# Patient Record
Sex: Male | Born: 1960 | Race: White | Hispanic: No | Marital: Married | Smoking: Never smoker
Health system: Southern US, Community
[De-identification: ages and names within clinical notes are randomized; demographics above are authoritative.]

## PROBLEM LIST (undated history)

## (undated) DIAGNOSIS — I10 Essential (primary) hypertension: Secondary | ICD-10-CM

## (undated) HISTORY — PX: BACK SURGERY: SHX140

---

## 2007-10-24 ENCOUNTER — Emergency Department (HOSPITAL_BASED_OUTPATIENT_CLINIC_OR_DEPARTMENT_OTHER): Admission: EM | Admit: 2007-10-24 | Discharge: 2007-10-24 | Payer: Self-pay | Admitting: Emergency Medicine

## 2013-05-18 ENCOUNTER — Other Ambulatory Visit: Payer: Self-pay | Admitting: Family Medicine

## 2013-05-18 DIAGNOSIS — IMO0002 Reserved for concepts with insufficient information to code with codable children: Secondary | ICD-10-CM

## 2013-05-22 ENCOUNTER — Ambulatory Visit
Admission: RE | Admit: 2013-05-22 | Discharge: 2013-05-22 | Disposition: A | Discharge status: Home / Self Care | Payer: Federal, State, Local not specified - PPO | Source: Ambulatory Visit | Attending: Family Medicine | Admitting: Family Medicine

## 2013-05-22 DIAGNOSIS — IMO0002 Reserved for concepts with insufficient information to code with codable children: Secondary | ICD-10-CM

## 2015-06-30 DIAGNOSIS — Z125 Encounter for screening for malignant neoplasm of prostate: Secondary | ICD-10-CM | POA: Diagnosis not present

## 2015-06-30 DIAGNOSIS — Z0000 Encounter for general adult medical examination without abnormal findings: Secondary | ICD-10-CM | POA: Diagnosis not present

## 2015-06-30 DIAGNOSIS — Z23 Encounter for immunization: Secondary | ICD-10-CM | POA: Diagnosis not present

## 2015-06-30 DIAGNOSIS — Z13220 Encounter for screening for lipoid disorders: Secondary | ICD-10-CM | POA: Diagnosis not present

## 2015-07-01 DIAGNOSIS — K080 Exfoliation of teeth due to systemic causes: Secondary | ICD-10-CM | POA: Diagnosis not present

## 2016-01-07 DIAGNOSIS — K080 Exfoliation of teeth due to systemic causes: Secondary | ICD-10-CM | POA: Diagnosis not present

## 2016-01-13 DIAGNOSIS — K080 Exfoliation of teeth due to systemic causes: Secondary | ICD-10-CM | POA: Diagnosis not present

## 2016-03-18 DIAGNOSIS — J069 Acute upper respiratory infection, unspecified: Secondary | ICD-10-CM | POA: Diagnosis not present

## 2016-05-26 DIAGNOSIS — K080 Exfoliation of teeth due to systemic causes: Secondary | ICD-10-CM | POA: Diagnosis not present

## 2016-05-27 DIAGNOSIS — K080 Exfoliation of teeth due to systemic causes: Secondary | ICD-10-CM | POA: Diagnosis not present

## 2016-07-13 DIAGNOSIS — Z0000 Encounter for general adult medical examination without abnormal findings: Secondary | ICD-10-CM | POA: Diagnosis not present

## 2016-07-13 DIAGNOSIS — K080 Exfoliation of teeth due to systemic causes: Secondary | ICD-10-CM | POA: Diagnosis not present

## 2017-01-31 DIAGNOSIS — K080 Exfoliation of teeth due to systemic causes: Secondary | ICD-10-CM | POA: Diagnosis not present

## 2017-03-23 DIAGNOSIS — K080 Exfoliation of teeth due to systemic causes: Secondary | ICD-10-CM | POA: Diagnosis not present

## 2017-08-12 DIAGNOSIS — J309 Allergic rhinitis, unspecified: Secondary | ICD-10-CM | POA: Diagnosis not present

## 2017-08-12 DIAGNOSIS — Z125 Encounter for screening for malignant neoplasm of prostate: Secondary | ICD-10-CM | POA: Diagnosis not present

## 2017-08-12 DIAGNOSIS — Z0000 Encounter for general adult medical examination without abnormal findings: Secondary | ICD-10-CM | POA: Diagnosis not present

## 2017-08-12 DIAGNOSIS — Z13220 Encounter for screening for lipoid disorders: Secondary | ICD-10-CM | POA: Diagnosis not present

## 2017-08-12 DIAGNOSIS — I10 Essential (primary) hypertension: Secondary | ICD-10-CM | POA: Diagnosis not present

## 2017-10-31 DIAGNOSIS — K080 Exfoliation of teeth due to systemic causes: Secondary | ICD-10-CM | POA: Diagnosis not present

## 2018-01-10 DIAGNOSIS — K080 Exfoliation of teeth due to systemic causes: Secondary | ICD-10-CM | POA: Diagnosis not present

## 2018-05-15 DIAGNOSIS — K080 Exfoliation of teeth due to systemic causes: Secondary | ICD-10-CM | POA: Diagnosis not present

## 2018-05-31 DIAGNOSIS — H5203 Hypermetropia, bilateral: Secondary | ICD-10-CM | POA: Diagnosis not present

## 2018-10-28 ENCOUNTER — Other Ambulatory Visit: Payer: Self-pay

## 2018-10-28 ENCOUNTER — Emergency Department (HOSPITAL_BASED_OUTPATIENT_CLINIC_OR_DEPARTMENT_OTHER)
Admission: EM | Admit: 2018-10-28 | Discharge: 2018-10-28 | Disposition: A | Discharge status: Home / Self Care | Payer: Federal, State, Local not specified - PPO | Attending: Emergency Medicine | Admitting: Emergency Medicine

## 2018-10-28 ENCOUNTER — Encounter (HOSPITAL_BASED_OUTPATIENT_CLINIC_OR_DEPARTMENT_OTHER): Payer: Self-pay | Admitting: Emergency Medicine

## 2018-10-28 ENCOUNTER — Emergency Department (HOSPITAL_BASED_OUTPATIENT_CLINIC_OR_DEPARTMENT_OTHER): Payer: Federal, State, Local not specified - PPO

## 2018-10-28 DIAGNOSIS — R5383 Other fatigue: Secondary | ICD-10-CM | POA: Diagnosis not present

## 2018-10-28 DIAGNOSIS — I10 Essential (primary) hypertension: Secondary | ICD-10-CM | POA: Diagnosis not present

## 2018-10-28 DIAGNOSIS — U071 COVID-19: Secondary | ICD-10-CM | POA: Diagnosis not present

## 2018-10-28 DIAGNOSIS — R509 Fever, unspecified: Secondary | ICD-10-CM | POA: Insufficient documentation

## 2018-10-28 DIAGNOSIS — R1110 Vomiting, unspecified: Secondary | ICD-10-CM | POA: Diagnosis not present

## 2018-10-28 DIAGNOSIS — R05 Cough: Secondary | ICD-10-CM | POA: Diagnosis not present

## 2018-10-28 DIAGNOSIS — Z79899 Other long term (current) drug therapy: Secondary | ICD-10-CM | POA: Insufficient documentation

## 2018-10-28 DIAGNOSIS — R531 Weakness: Secondary | ICD-10-CM | POA: Diagnosis not present

## 2018-10-28 HISTORY — DX: Essential (primary) hypertension: I10

## 2018-10-28 LAB — CBC
HCT: 44.1 % (ref 39.0–52.0)
Hemoglobin: 14.5 g/dL (ref 13.0–17.0)
MCH: 30.5 pg (ref 26.0–34.0)
MCHC: 32.9 g/dL (ref 30.0–36.0)
MCV: 92.6 fL (ref 80.0–100.0)
Platelets: 209 10*3/uL (ref 150–400)
RBC: 4.76 MIL/uL (ref 4.22–5.81)
RDW: 11.9 % (ref 11.5–15.5)
WBC: 6.8 10*3/uL (ref 4.0–10.5)
nRBC: 0 % (ref 0.0–0.2)

## 2018-10-28 LAB — URINALYSIS, ROUTINE W REFLEX MICROSCOPIC
Bilirubin Urine: NEGATIVE
Glucose, UA: NEGATIVE mg/dL
Hgb urine dipstick: NEGATIVE
Ketones, ur: NEGATIVE mg/dL
Leukocytes,Ua: NEGATIVE
Nitrite: NEGATIVE
Protein, ur: NEGATIVE mg/dL
Specific Gravity, Urine: 1.02 (ref 1.005–1.030)
pH: 7.5 (ref 5.0–8.0)

## 2018-10-28 LAB — SARS CORONAVIRUS 2 BY RT PCR (HOSPITAL ORDER, PERFORMED IN ~~LOC~~ HOSPITAL LAB): SARS Coronavirus 2: POSITIVE — AB

## 2018-10-28 LAB — BASIC METABOLIC PANEL
Anion gap: 9 (ref 5–15)
BUN: 13 mg/dL (ref 6–20)
CO2: 29 mmol/L (ref 22–32)
Calcium: 9 mg/dL (ref 8.9–10.3)
Chloride: 99 mmol/L (ref 98–111)
Creatinine, Ser: 1.08 mg/dL (ref 0.61–1.24)
GFR calc Af Amer: >60 mL/min (ref >60)
GFR calc non Af Amer: >60 mL/min (ref >60)
Glucose, Bld: 102 mg/dL — ABNORMAL HIGH (ref 70–99)
Potassium: 3.1 mmol/L — ABNORMAL LOW (ref 3.5–5.1)
Sodium: 137 mmol/L (ref 135–145)

## 2018-10-28 MED ORDER — SODIUM CHLORIDE 0.9 % IV BOLUS (SEPSIS)
500.0000 mL | Freq: Once | INTRAVENOUS | Status: AC
  Administered 2018-10-28: 500 mL via INTRAVENOUS

## 2018-10-28 MED ORDER — ONDANSETRON HCL 4 MG/2ML IJ SOLN
INTRAMUSCULAR | Status: AC
  Administered 2018-10-28: 4 mg
  Filled 2018-10-28: qty 2

## 2018-10-28 MED ORDER — SODIUM CHLORIDE 0.9 % IV SOLN
1000.0000 mL | INTRAVENOUS | Status: DC
  Administered 2018-10-28: 1000 mL via INTRAVENOUS

## 2018-10-28 MED ORDER — ONDANSETRON 8 MG PO TBDP: 8.0000 mg | ORAL_TABLET | Freq: Three times a day (TID) | ORAL | 0 refills | Status: AC | PRN

## 2018-10-28 NOTE — ED Notes (Signed)
ED Provider at bedside to update pt .

## 2018-10-28 NOTE — Discharge Instructions (Addendum)
Please review the discharge instructions regarding COVID-19.  Stay home away from work until you are cleared to return.  Return to the emergency room if you start having shortness of breath or other concerning symptoms

## 2018-10-28 NOTE — ED Triage Notes (Signed)
Patient states that he stared to feel weak and fatigued on Thursday after mowing the grass. Since then he started to have a cough and fever as high at 101.9 - patient took tylenol at 0400 - patient states that he feels better right now just weak  - Dr. Rex Kras sent here for evaluation of Pneumonia

## 2018-10-28 NOTE — ED Notes (Signed)
ED MD and Cristie Hem RN informed about +COVID

## 2018-10-28 NOTE — ED Notes (Signed)
While triaging the patient he became lighthead and felt like he was going to pass out - patient did have LOC x 1 minutes - Vitals obtained HR down to 44 and b/p down to 71/48 - patient aroused after 1 minutes MD to the bedside and orders received - patient moved to Room for continual monitoring

## 2018-10-28 NOTE — ED Provider Notes (Signed)
[facility] EMERGENCY DEPARTMENT Provider Note   CSN: [id number] Arrival date & time: 10/28/18  1058    History   Chief Complaint Chief Complaint  Patient presents with  . Cough  . Loss of Consciousness    HPI Nicholas Shields is a 58 y.o. male.     HPI Pt started having sx earlier this week after working out in the heat.  He was mowing the lawn.  He did not think too much of it but felt very fatigued.  He started to develop a cough.  He was not able to go to work yesterday.  He started having a fever up to 101.  He began shaking and shivering.  Pt called the doctor today and they suggested he go to the ED for evaluation.  While in triage he had an episode of feeling weak as if he was going to pass out.  Pt vomited.  He is feeling better at this time. Past Medical History:  Diagnosis Date  . Hypertension     There are no active problems to display for this patient.   History reviewed. No pertinent surgical history.      Home Medications    Prior to Admission medications   Medication Sig Start Date End Date Taking? Authorizing Provider  fluticasone Asencion Islam) 50 MCG/ACT nasal spray  05/10/18   [provider]  hydrochlorothiazide (HYDRODIURIL) 25 MG tablet  09/14/18   [provider]  levocetirizine (XYZAL) 5 MG tablet  09/18/18   [provider]  losartan (COZAAR) 100 MG tablet  09/14/18   [provider]  ondansetron (ZOFRAN ODT) 8 MG disintegrating tablet Take 1 tablet (8 mg total) by mouth every 8 (eight) hours as needed for nausea or vomiting. 10/28/18   Dorie Rank, MD    Family History History reviewed. No pertinent family history.  Social History Social History   Tobacco Use  . Smoking status: Never Smoker  . Smokeless tobacco: Never Used  Substance Use Topics  . Alcohol use: Never    Frequency: Never  . Drug use: Never     Allergies   Penicillins   Review of Systems Review of Systems  Constitutional:  Positive for fever.  Respiratory: Positive for cough.   Cardiovascular: Negative for chest pain.  Gastrointestinal: Negative for abdominal pain.  Genitourinary: Negative for dysuria.  Neurological: Negative for headaches.     Physical Exam Updated Vital Signs BP 133/82   Pulse 78   Temp 99.1 F (37.3 C) (Oral)   Resp 13   Ht 1.753 m (5\' 9" )   Wt 81.6 kg   SpO2 97%   BMI 26.58 kg/m   Physical Exam Vitals signs and nursing note reviewed.  Constitutional:      General: He is not in acute distress.    Appearance: He is well-developed.  HENT:     Head: Normocephalic and atraumatic.     Right Ear: External ear normal.     Left Ear: External ear normal.  Eyes:     General: No scleral icterus.       Right eye: No discharge.        Left eye: No discharge.     Conjunctiva/sclera: Conjunctivae normal.  Neck:     Musculoskeletal: Neck supple.     Trachea: No tracheal deviation.  Cardiovascular:     Rate and Rhythm: Normal rate and regular rhythm.  Pulmonary:     Effort: Pulmonary effort is normal. No respiratory distress.  Breath sounds: Normal breath sounds. No stridor. No wheezing or rales.  Abdominal:     General: Bowel sounds are normal. There is no distension.     Palpations: Abdomen is soft.     Tenderness: There is no abdominal tenderness. There is no guarding or rebound.  Musculoskeletal:        General: No tenderness.  Skin:    General: Skin is warm and dry.     Findings: No rash.  Neurological:     Mental Status: He is alert.     Cranial Nerves: No cranial nerve deficit (no facial droop, extraocular movements intact, no slurred speech).     Sensory: No sensory deficit.     Motor: No abnormal muscle tone or seizure activity.     Coordination: Coordination normal.      ED Treatments / Results  Labs (all labs ordered are listed, but only abnormal results are displayed) Labs Reviewed  SARS CORONAVIRUS 2 (HOSPITAL ORDER, PERFORMED IN [facility]  LAB) - Abnormal; Notable for the following components:      Result Value   SARS Coronavirus 2 POSITIVE (*)    All other components within normal limits  BASIC METABOLIC PANEL - Abnormal; Notable for the following components:   Potassium 3.1 (*)    Glucose, Bld 102 (*)    All other components within normal limits  CBC  URINALYSIS, ROUTINE W REFLEX MICROSCOPIC    EKG EKG Interpretation  Date/Time:  Saturday October 28 2018 11:16:41 EDT Ventricular Rate:  72 PR Interval:  140 QRS Duration: 88 QT Interval:  354 QTC Calculation: 387 R Axis:   41 Text Interpretation:  ** Poor data quality, interpretation may be adversely affected Normal sinus rhythm Nonspecific T wave abnormality Abnormal ECG Poor data quality in current ECG precludes serial comparison No previous tracing Confirmed by Linwood DibblesKnapp, Chaniqua Brisby [phone]([id number]) on 10/28/2018 11:25:34 AM   Radiology Dg Chest Portable 1 View  Result Date: 10/28/2018 CLINICAL DATA:  Cough. EXAM: PORTABLE CHEST 1 VIEW COMPARISON:  None FINDINGS: The heart size appears within normal limits. There is scar versus platelike atelectasis in the left midlung and left base. No airspace opacities. No pulmonary edema or pleural effusion. IMPRESSION: Left midlung and left base scar versus platelike atelectasis. Electronically Signed   By: Signa Kellaylor  Stroud M.D.   On: 10/28/2018 12:14    Procedures Procedures (including critical care time)  Medications Ordered in ED Medications  ondansetron (ZOFRAN) 4 MG/2ML injection (4 mg  Given 10/28/18 1130)  sodium chloride 0.9 % bolus 500 mL (0 mLs Intravenous Stopped 10/28/18 1153)     Initial Impression / Assessment and Plan / ED Course  I have reviewed the triage vital signs and the nursing notes.  Pertinent labs & imaging results that were available during my care of the patient were reviewed by me and considered in my medical decision making (see chart for details).  Clinical Course as of Oct 28 1324  Sat Oct 28, 2018  1142 Bp  129/80   [JK]  1304 Patient's coronavirus test is positive.  Vitals are otherwise reassuring.  No hypoxia.   [JK]  1304 Chest x-ray without any definitive signs of pneumonia.   [JK]    Clinical Course User Index [JK] Linwood DibblesKnapp, Karolee Meloni, MD     Patient's ED work-up is notable for positive COVID-19.  This is the etiology of his weakness and fatigue.  Fortunately no signs of pneumonia and he has remained hemodynamically stable with no signs of any  hypoxia.  Patient did have a vasovagal episode after vomiting at triage.  His blood pressure has remained stable and he has not no further episodes of hypotension.  I doubt sepsis or acute cardiovascular event.  Patient was comfortable going home.  Will discharge with Zofran to take as needed.  Discussed the importance of returning if he has worsening issues with his breathing.  Final Clinical Impressions(s) / ED Diagnoses   Final diagnoses:  2019 novel coronavirus disease (COVID-19)    ED Discharge Orders         [facility]     10/28/18 1459    Temperature monitoring     10/28/18 1459    ondansetron (ZOFRAN ODT) 8 MG disintegrating tablet  Every 8 hours PRN     10/28/18 1500           Linwood DibblesKnapp, Charrisse Masley, MD 10/29/18 1326

## 2018-11-02 DIAGNOSIS — I10 Essential (primary) hypertension: Secondary | ICD-10-CM | POA: Diagnosis not present

## 2018-11-02 DIAGNOSIS — U071 COVID-19: Secondary | ICD-10-CM | POA: Diagnosis not present

## 2018-11-02 DIAGNOSIS — G479 Sleep disorder, unspecified: Secondary | ICD-10-CM | POA: Diagnosis not present

## 2018-11-02 DIAGNOSIS — E876 Hypokalemia: Secondary | ICD-10-CM | POA: Diagnosis not present

## 2018-12-07 DIAGNOSIS — E876 Hypokalemia: Secondary | ICD-10-CM | POA: Diagnosis not present

## 2018-12-12 DIAGNOSIS — H6123 Impacted cerumen, bilateral: Secondary | ICD-10-CM | POA: Diagnosis not present

## 2018-12-12 DIAGNOSIS — H9312 Tinnitus, left ear: Secondary | ICD-10-CM | POA: Diagnosis not present

## 2018-12-12 DIAGNOSIS — H9012 Conductive hearing loss, unilateral, left ear, with unrestricted hearing on the contralateral side: Secondary | ICD-10-CM | POA: Diagnosis not present

## 2019-10-24 DIAGNOSIS — B029 Zoster without complications: Secondary | ICD-10-CM | POA: Diagnosis not present

## 2019-12-19 DIAGNOSIS — D485 Neoplasm of uncertain behavior of skin: Secondary | ICD-10-CM | POA: Diagnosis not present

## 2019-12-19 DIAGNOSIS — Z125 Encounter for screening for malignant neoplasm of prostate: Secondary | ICD-10-CM | POA: Diagnosis not present

## 2019-12-19 DIAGNOSIS — E559 Vitamin D deficiency, unspecified: Secondary | ICD-10-CM | POA: Diagnosis not present

## 2019-12-19 DIAGNOSIS — Z0000 Encounter for general adult medical examination without abnormal findings: Secondary | ICD-10-CM | POA: Diagnosis not present

## 2019-12-19 DIAGNOSIS — J309 Allergic rhinitis, unspecified: Secondary | ICD-10-CM | POA: Diagnosis not present

## 2019-12-19 DIAGNOSIS — I10 Essential (primary) hypertension: Secondary | ICD-10-CM | POA: Diagnosis not present

## 2020-02-12 ENCOUNTER — Emergency Department (HOSPITAL_BASED_OUTPATIENT_CLINIC_OR_DEPARTMENT_OTHER): Payer: Federal, State, Local not specified - PPO

## 2020-02-12 ENCOUNTER — Encounter (HOSPITAL_BASED_OUTPATIENT_CLINIC_OR_DEPARTMENT_OTHER): Payer: Self-pay | Admitting: *Deleted

## 2020-02-12 ENCOUNTER — Emergency Department (HOSPITAL_BASED_OUTPATIENT_CLINIC_OR_DEPARTMENT_OTHER)
Admission: EM | Admit: 2020-02-12 | Discharge: 2020-02-12 | Disposition: A | Discharge status: Home / Self Care | Payer: Federal, State, Local not specified - PPO | Attending: Emergency Medicine | Admitting: Emergency Medicine

## 2020-02-12 ENCOUNTER — Other Ambulatory Visit: Payer: Self-pay

## 2020-02-12 ENCOUNTER — Other Ambulatory Visit (HOSPITAL_BASED_OUTPATIENT_CLINIC_OR_DEPARTMENT_OTHER): Payer: Self-pay | Admitting: Emergency Medicine

## 2020-02-12 DIAGNOSIS — R1030 Lower abdominal pain, unspecified: Secondary | ICD-10-CM | POA: Diagnosis not present

## 2020-02-12 DIAGNOSIS — N200 Calculus of kidney: Secondary | ICD-10-CM | POA: Diagnosis not present

## 2020-02-12 DIAGNOSIS — I10 Essential (primary) hypertension: Secondary | ICD-10-CM | POA: Insufficient documentation

## 2020-02-12 DIAGNOSIS — Z79899 Other long term (current) drug therapy: Secondary | ICD-10-CM | POA: Diagnosis not present

## 2020-02-12 DIAGNOSIS — R1110 Vomiting, unspecified: Secondary | ICD-10-CM | POA: Diagnosis not present

## 2020-02-12 LAB — URINALYSIS, ROUTINE W REFLEX MICROSCOPIC
Glucose, UA: NEGATIVE mg/dL
Ketones, ur: 15 mg/dL — AB
Nitrite: NEGATIVE
Protein, ur: 100 mg/dL — AB
Specific Gravity, Urine: >1.03 — ABNORMAL HIGH (ref 1.005–1.030)
pH: 6 (ref 5.0–8.0)

## 2020-02-12 LAB — COMPREHENSIVE METABOLIC PANEL
ALT: 29 U/L (ref 0–44)
AST: 33 U/L (ref 15–41)
Albumin: 4.4 g/dL (ref 3.5–5.0)
Alkaline Phosphatase: 51 U/L (ref 38–126)
Anion gap: 10 (ref 5–15)
BUN: 19 mg/dL (ref 6–20)
CO2: 26 mmol/L (ref 22–32)
Calcium: 10 mg/dL (ref 8.9–10.3)
Chloride: 99 mmol/L (ref 98–111)
Creatinine, Ser: 1.11 mg/dL (ref 0.61–1.24)
GFR, Estimated: >60 mL/min (ref >60)
Glucose, Bld: 152 mg/dL — ABNORMAL HIGH (ref 70–99)
Potassium: 3.9 mmol/L (ref 3.5–5.1)
Sodium: 135 mmol/L (ref 135–145)
Total Bilirubin: 0.9 mg/dL (ref 0.3–1.2)
Total Protein: 7.7 g/dL (ref 6.5–8.1)

## 2020-02-12 LAB — CBC
HCT: 46.6 % (ref 39.0–52.0)
Hemoglobin: 15.8 g/dL (ref 13.0–17.0)
MCH: 31 pg (ref 26.0–34.0)
MCHC: 33.9 g/dL (ref 30.0–36.0)
MCV: 91.4 fL (ref 80.0–100.0)
Platelets: 254 10*3/uL (ref 150–400)
RBC: 5.1 MIL/uL (ref 4.22–5.81)
RDW: 11.6 % (ref 11.5–15.5)
WBC: 13.1 10*3/uL — ABNORMAL HIGH (ref 4.0–10.5)
nRBC: 0 % (ref 0.0–0.2)

## 2020-02-12 LAB — URINALYSIS, MICROSCOPIC (REFLEX): RBC / HPF: >50 RBC/hpf (ref 0–5)

## 2020-02-12 LAB — LIPASE, BLOOD: Lipase: 26 U/L (ref 11–51)

## 2020-02-12 MED ORDER — OXYCODONE HCL 5 MG PO TABS: 5.0000 mg | ORAL_TABLET | Freq: Four times a day (QID) | ORAL | 0 refills | Status: DC | PRN

## 2020-02-12 MED ORDER — SODIUM CHLORIDE 0.9 % IV BOLUS
1000.0000 mL | Freq: Once | INTRAVENOUS | Status: AC
  Administered 2020-02-12: 1000 mL via INTRAVENOUS

## 2020-02-12 MED ORDER — ONDANSETRON HCL 4 MG PO TABS: 4.0000 mg | ORAL_TABLET | Freq: Three times a day (TID) | ORAL | 0 refills | Status: DC | PRN

## 2020-02-12 MED ORDER — IOHEXOL 300 MG/ML  SOLN
100.0000 mL | Freq: Once | INTRAMUSCULAR | Status: AC | PRN
  Administered 2020-02-12: 100 mL via INTRAVENOUS

## 2020-02-12 MED ORDER — FENTANYL CITRATE (PF) 100 MCG/2ML IJ SOLN
50.0000 ug | Freq: Once | INTRAMUSCULAR | Status: AC
  Administered 2020-02-12: 50 ug via INTRAVENOUS
  Filled 2020-02-12: qty 2

## 2020-02-12 MED ORDER — TAMSULOSIN HCL 0.4 MG PO CAPS: 0.4000 mg | ORAL_CAPSULE | Freq: Every day | ORAL | 0 refills | Status: DC

## 2020-02-12 MED ORDER — ONDANSETRON HCL 4 MG/2ML IJ SOLN
4.0000 mg | Freq: Once | INTRAMUSCULAR | Status: AC
  Administered 2020-02-12: 4 mg via INTRAVENOUS
  Filled 2020-02-12: qty 2

## 2020-02-12 MED FILL — ONDANSETRON HCL 4 MG TABLET: 4 | 5 days supply | Qty: 15 | Fill #0

## 2020-02-12 MED FILL — oxyCODONE HCL 5 MG TABS: 5 | 3 days supply | Qty: 15 | Fill #0

## 2020-02-12 MED FILL — TAMSULOSIN HCL 0.4 MG CAP: 0.4 | 7 days supply | Qty: 7 | Fill #0

## 2020-02-12 NOTE — ED Triage Notes (Signed)
Right flank pain sudden onset late last night. Vomiting.  This am he is having lower abdominal pain. Pale. crampy pain. Urine dark.

## 2020-02-12 NOTE — ED Notes (Signed)
Retrum from CT

## 2020-02-12 NOTE — ED Provider Notes (Signed)
[facility] EMERGENCY DEPARTMENT Provider Note   CSN: [id number] Arrival date & time: 02/12/20  1449     History Chief Complaint  Patient presents with  . Abdominal Pain    Nicholas Shields is a 59 y.o. male.  The history is provided by the patient.  Abdominal Pain Pain location:  R flank and suprapubic Pain quality: aching   Pain radiates to:  Does not radiate Pain severity:  Moderate Onset quality:  Sudden Timing:  Constant Progression:  Unchanged Chronicity:  New Context: not recent illness   Relieved by:  Nothing Worsened by:  Nothing Associated symptoms: no chest pain, no chills, no cough, no dysuria, no fever, no hematuria, no shortness of breath, no sore throat and no vomiting        Past Medical History:  Diagnosis Date  . Hypertension     There are no problems to display for this patient.   Past Surgical History:  Procedure Laterality Date  . BACK SURGERY         No family history on file.  Social History   Tobacco Use  . Smoking status: Never Smoker  . Smokeless tobacco: Never Used  Substance Use Topics  . Alcohol use: Never  . Drug use: Never    Home Medications Prior to Admission medications   Medication Sig Start Date End Date Taking? Authorizing Provider  fluticasone (FLONASE) 50 MCG/ACT nasal spray  05/10/18  Yes [provider]  hydrochlorothiazide (HYDRODIURIL) 25 MG tablet  09/14/18  Yes [provider]  losartan (COZAAR) 100 MG tablet  09/14/18  Yes [provider]  ondansetron (ZOFRAN ODT) 8 MG disintegrating tablet Take 1 tablet (8 mg total) by mouth every 8 (eight) hours as needed for nausea or vomiting. 10/28/18  Yes Linwood Dibbles, MD  levocetirizine Elita Boone) 5 MG tablet  09/18/18   [provider]  ondansetron (ZOFRAN) 4 MG tablet Take 1 tablet (4 mg total) by mouth every 8 (eight) hours as needed for up to 15 doses for nausea or vomiting. 02/12/20   Beonka Amesquita, DO  oxyCODONE  (ROXICODONE) 5 MG immediate release tablet Take 1 tablet (5 mg total) by mouth every 6 (six) hours as needed for up to 15 doses for severe pain or breakthrough pain. 02/12/20   Carly Sabo, DO  tamsulosin (FLOMAX) 0.4 MG CAPS capsule Take 1 capsule (0.4 mg total) by mouth daily for 7 days. 02/12/20 02/19/20  Virgina Norfolk, DO    Allergies    Penicillins  Review of Systems   Review of Systems  Constitutional: Negative for chills and fever.  HENT: Negative for ear pain and sore throat.   Eyes: Negative for pain and visual disturbance.  Respiratory: Negative for cough and shortness of breath.   Cardiovascular: Negative for chest pain and palpitations.  Gastrointestinal: Positive for abdominal pain. Negative for vomiting.  Genitourinary: Negative for dysuria and hematuria.  Musculoskeletal: Negative for arthralgias and back pain.  Skin: Negative for color change and rash.  Neurological: Negative for seizures and syncope.  All other systems reviewed and are negative.   Physical Exam Updated Vital Signs  ED Triage Vitals  Enc Vitals Group     BP 02/12/20 1505 123/83     Pulse Rate 02/12/20 1505 76     Resp 02/12/20 1505 16     Temp 02/12/20 1505 97.8 F (36.6 C)     Temp Source 02/12/20 1505 Oral     SpO2 02/12/20 1505 100 %  Weight 02/12/20 1505 182 lb (82.6 kg)     Height 02/12/20 1505 5\' 9"  (1.753 m)     Head Circumference --      Peak Flow --      Pain Score 02/12/20 1513 7     Pain Loc --      Pain Edu? --      Excl. in GC? --     Physical Exam Vitals and nursing note reviewed.  Constitutional:      General: He is not in acute distress.    Appearance: He is well-developed. He is not ill-appearing.  HENT:     Head: Normocephalic and atraumatic.     Mouth/Throat:     Mouth: Mucous membranes are moist.  Eyes:     Extraocular Movements: Extraocular movements intact.     Conjunctiva/sclera: Conjunctivae normal.     Pupils: Pupils are equal, round, and reactive  to light.  Cardiovascular:     Rate and Rhythm: Normal rate and regular rhythm.     Heart sounds: Normal heart sounds. No murmur heard.   Pulmonary:     Effort: Pulmonary effort is normal. No respiratory distress.     Breath sounds: Normal breath sounds.  Abdominal:     General: There is no distension.     Palpations: Abdomen is soft.     Tenderness: There is abdominal tenderness in the suprapubic area. There is no right CVA tenderness, left CVA tenderness, guarding or rebound. Negative signs include Murphy's sign and Rovsing's sign.  Musculoskeletal:     Cervical back: Neck supple.  Skin:    General: Skin is warm and dry.     Capillary Refill: Capillary refill takes less than 2 seconds.  Neurological:     General: No focal deficit present.     Mental Status: He is alert.     ED Results / Procedures / Treatments   Labs (all labs ordered are listed, but only abnormal results are displayed) Labs Reviewed  COMPREHENSIVE METABOLIC PANEL - Abnormal; Notable for the following components:      Result Value   Glucose, Bld 152 (*)    All other components within normal limits  CBC - Abnormal; Notable for the following components:   WBC 13.1 (*)    All other components within normal limits  URINALYSIS, ROUTINE W REFLEX MICROSCOPIC - Abnormal; Notable for the following components:   Color, Urine BROWN (*)    APPearance TURBID (*)    Specific Gravity, Urine >1.030 (*)    Hgb urine dipstick LARGE (*)    Bilirubin Urine SMALL (*)    Ketones, ur 15 (*)    Protein, ur 100 (*)    Leukocytes,Ua TRACE (*)    All other components within normal limits  URINALYSIS, MICROSCOPIC (REFLEX) - Abnormal; Notable for the following components:   Bacteria, UA FEW (*)    All other components within normal limits  LIPASE, BLOOD    EKG EKG Interpretation  Date/Time:  Tuesday February 12 2020 15:42:13 EST Ventricular Rate:  72 PR Interval:    QRS Duration: 91 QT Interval:  380 QTC  Calculation: 416 R Axis:   54 Text Interpretation: Sinus rhythm Consider left atrial enlargement Confirmed by [id number] [phone]) on 02/12/2020 3:46:02 PM   Radiology CT ABDOMEN PELVIS W CONTRAST  Result Date: 02/12/2020 CLINICAL DATA:  Sudden onset right flank pain, vomiting EXAM: CT ABDOMEN AND PELVIS WITH CONTRAST TECHNIQUE: Multidetector CT imaging of the abdomen and pelvis was performed using  the standard protocol following bolus administration of intravenous contrast. CONTRAST:  OMNIPAQUE IOHEXOL 300 MG/ML  SOLN COMPARISON:  None. FINDINGS: Lower chest: No acute pleural or parenchymal lung disease. Hepatobiliary: No focal liver abnormality is seen. No gallstones, gallbladder wall thickening, or biliary dilatation. Pancreas: Unremarkable. No pancreatic ductal dilatation or surrounding inflammatory changes. Spleen: Normal in size without focal abnormality. Adrenals/Urinary Tract: There is high-grade right-sided obstructive uropathy related to a 3 mm distal right ureteral calculus, reference image 76/2. This results in right-sided hydronephrosis and hydroureter, with delayed excretion of contrast from the right kidney. The left kidney enhances normally. Bladder is decompressed, which limits evaluation. The adrenals are normal. Stomach/Bowel: No bowel obstruction or ileus. No bowel wall thickening or inflammatory change. Vascular/Lymphatic: Aortic atherosclerosis. No enlarged abdominal or pelvic lymph nodes. Reproductive: Prostate is unremarkable. Other: No free fluid or free gas. No abdominal wall hernia. Musculoskeletal: No acute or destructive bony lesions. IMPRESSION: High-grade right-sided obstructive uropathy related to a 3 mm distal right ureteral calculus. Electronically Signed   By: Sharlet Salina M.D.   On: 02/12/2020 16:23    Procedures Procedures (including critical care time)  Medications Ordered in ED Medications  sodium chloride 0.9 % bolus 1,000 mL (1,000 mLs Intravenous  New Bag/Given 02/12/20 1550)  ondansetron (ZOFRAN) injection 4 mg (4 mg Intravenous Given 02/12/20 1559)  fentaNYL (SUBLIMAZE) injection 50 mcg (50 mcg Intravenous Given 02/12/20 1601)  iohexol (OMNIPAQUE) 300 MG/ML solution 100 mL (100 mLs Intravenous Contrast Given 02/12/20 1609)    ED Course  I have reviewed the triage vital signs and the nursing notes.  Pertinent labs & imaging results that were available during my care of the patient were reviewed by me and considered in my medical decision making (see chart for details).    MDM Rules/Calculators/A&P                          Corlis Leak is here with right flank pain that started this morning.  No history of kidney stones.  No pain with urination but mostly having some suprapubic pain now on exam.  No history of appendicitis.  Concern for appendicitis, UTI, kidney stone.    CT scan did show right-sided kidney stone.  3 mm the distal right ureteral area.  No fever, no massive white count.  No urinary tract infection.  Normal kidney function.  Felt better after IV fluids and IV pain medicine.  Given prescription for narcotic pain medicine, Flomax, Zofran.  Understands return precautions.  We will have him follow-up with urology.  Suspect that stone will pass.  This chart was dictated using voice recognition software.  Despite best efforts to proofread,  errors can occur which can change the documentation meaning.    Final Clinical Impression(s) / ED Diagnoses Final diagnoses:  Kidney stone    Rx / DC Orders ED Discharge Orders         Ordered    oxyCODONE (ROXICODONE) 5 MG immediate release tablet  Every 6 hours PRN        02/12/20 1645    ondansetron (ZOFRAN) 4 MG tablet  Every 8 hours PRN        02/12/20 1645    tamsulosin (FLOMAX) 0.4 MG CAPS capsule  Daily        02/12/20 1645           Gable Odonohue, DO 02/12/20 1647

## 2020-03-17 DIAGNOSIS — N201 Calculus of ureter: Secondary | ICD-10-CM | POA: Diagnosis not present

## 2020-03-25 DIAGNOSIS — L820 Inflamed seborrheic keratosis: Secondary | ICD-10-CM | POA: Diagnosis not present

## 2020-03-25 DIAGNOSIS — D2262 Melanocytic nevi of left upper limb, including shoulder: Secondary | ICD-10-CM | POA: Diagnosis not present

## 2020-03-25 DIAGNOSIS — D485 Neoplasm of uncertain behavior of skin: Secondary | ICD-10-CM | POA: Diagnosis not present

## 2020-04-04 DIAGNOSIS — N201 Calculus of ureter: Secondary | ICD-10-CM | POA: Diagnosis not present

## 2020-04-07 DIAGNOSIS — N201 Calculus of ureter: Secondary | ICD-10-CM | POA: Diagnosis not present

## 2020-04-08 DIAGNOSIS — D485 Neoplasm of uncertain behavior of skin: Secondary | ICD-10-CM | POA: Diagnosis not present

## 2020-04-08 DIAGNOSIS — L988 Other specified disorders of the skin and subcutaneous tissue: Secondary | ICD-10-CM | POA: Diagnosis not present

## 2020-04-22 DIAGNOSIS — Z4802 Encounter for removal of sutures: Secondary | ICD-10-CM | POA: Diagnosis not present

## 2020-09-29 DIAGNOSIS — N201 Calculus of ureter: Secondary | ICD-10-CM | POA: Diagnosis not present

## 2020-12-15 DIAGNOSIS — L820 Inflamed seborrheic keratosis: Secondary | ICD-10-CM | POA: Diagnosis not present

## 2020-12-18 DIAGNOSIS — E559 Vitamin D deficiency, unspecified: Secondary | ICD-10-CM | POA: Diagnosis not present

## 2020-12-18 DIAGNOSIS — Z13220 Encounter for screening for lipoid disorders: Secondary | ICD-10-CM | POA: Diagnosis not present

## 2020-12-18 DIAGNOSIS — Z125 Encounter for screening for malignant neoplasm of prostate: Secondary | ICD-10-CM | POA: Diagnosis not present

## 2020-12-18 DIAGNOSIS — Z0000 Encounter for general adult medical examination without abnormal findings: Secondary | ICD-10-CM | POA: Diagnosis not present

## 2021-01-02 DIAGNOSIS — J309 Allergic rhinitis, unspecified: Secondary | ICD-10-CM | POA: Diagnosis not present

## 2021-01-02 DIAGNOSIS — Z0000 Encounter for general adult medical examination without abnormal findings: Secondary | ICD-10-CM | POA: Diagnosis not present

## 2021-01-02 DIAGNOSIS — Z23 Encounter for immunization: Secondary | ICD-10-CM | POA: Diagnosis not present

## 2021-01-02 DIAGNOSIS — I10 Essential (primary) hypertension: Secondary | ICD-10-CM | POA: Diagnosis not present

## 2021-01-02 DIAGNOSIS — E7800 Pure hypercholesterolemia, unspecified: Secondary | ICD-10-CM | POA: Diagnosis not present

## 2021-03-27 DIAGNOSIS — D125 Benign neoplasm of sigmoid colon: Secondary | ICD-10-CM | POA: Diagnosis not present

## 2021-03-27 DIAGNOSIS — D123 Benign neoplasm of transverse colon: Secondary | ICD-10-CM | POA: Diagnosis not present

## 2021-03-27 DIAGNOSIS — Z1211 Encounter for screening for malignant neoplasm of colon: Secondary | ICD-10-CM | POA: Diagnosis not present

## 2021-04-27 DIAGNOSIS — D225 Melanocytic nevi of trunk: Secondary | ICD-10-CM | POA: Diagnosis not present

## 2021-04-27 DIAGNOSIS — D1801 Hemangioma of skin and subcutaneous tissue: Secondary | ICD-10-CM | POA: Diagnosis not present

## 2021-07-15 IMAGING — CT CT ABD-PELV W/ CM
2 of 5 series · 16 of 46 positions shown, 18 images · IV contrast (Omnipaque)
Comparison: None.

CLINICAL DATA: Sudden onset right flank pain, vomiting

EXAM:
CT ABDOMEN AND PELVIS WITH CONTRAST
TECHNIQUE: Multidetector CT imaging of the abdomen and pelvis was performed
using the standard protocol following bolus administration of
intravenous contrast.
CONTRAST:  100mL OMNIPAQUE IOHEXOL 300 MG/ML  SOLN

[Series 2: axial st · axial · 0.79mm/px · z∈[-468,-38]mm · 13 of 98 slices shown, 15 images]
[im 6/98  soft-tissue]
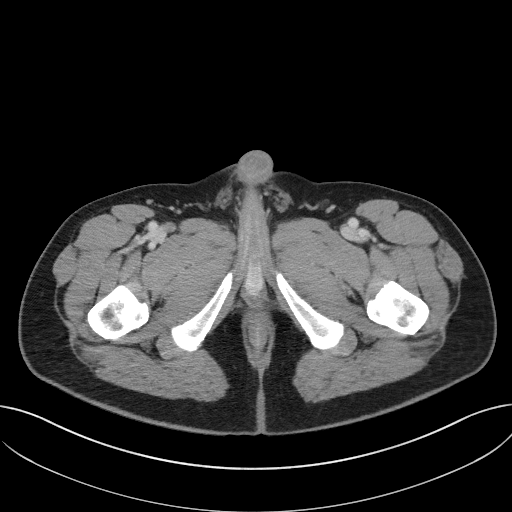
[im 6/98  bone]
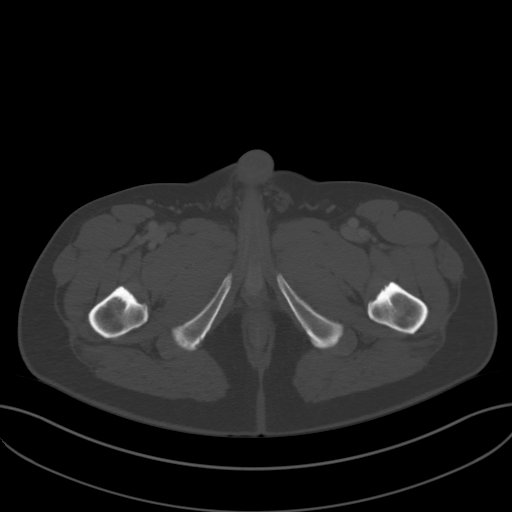
[im 11/98  soft-tissue]
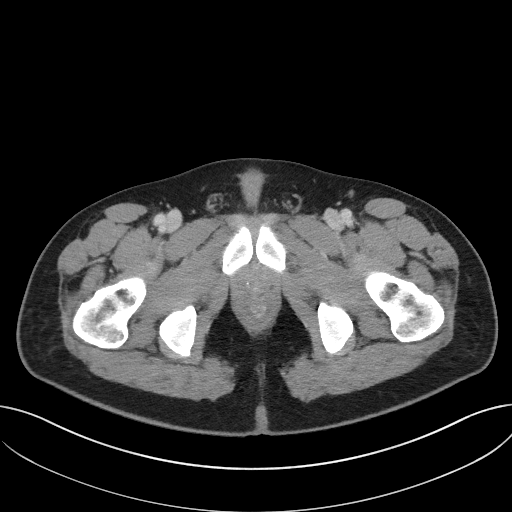
[im 22/98  soft-tissue]
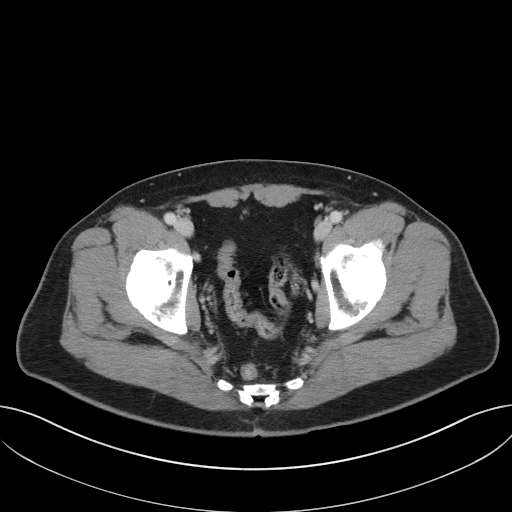
[im 27/98  soft-tissue]
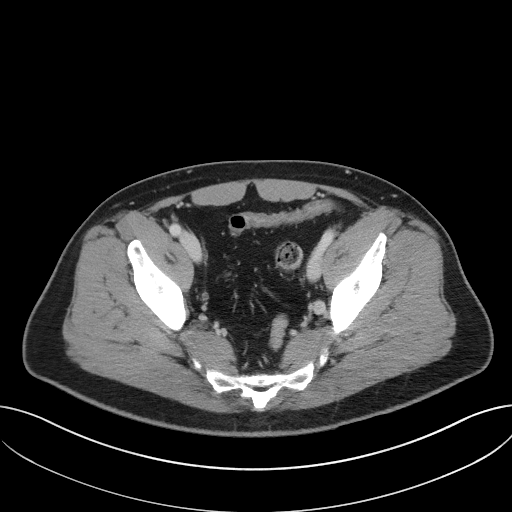
[im 33/98  soft-tissue]
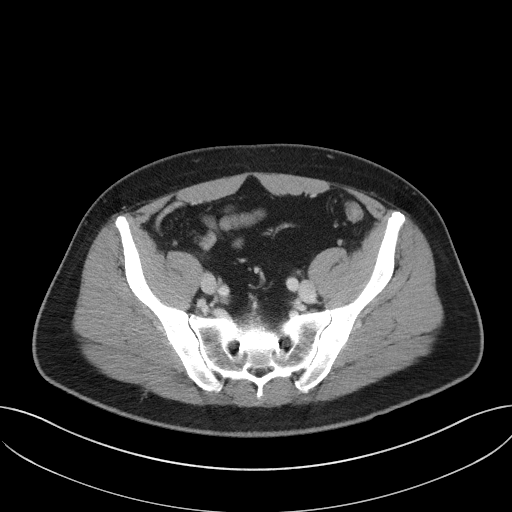
[im 44/98  soft-tissue]
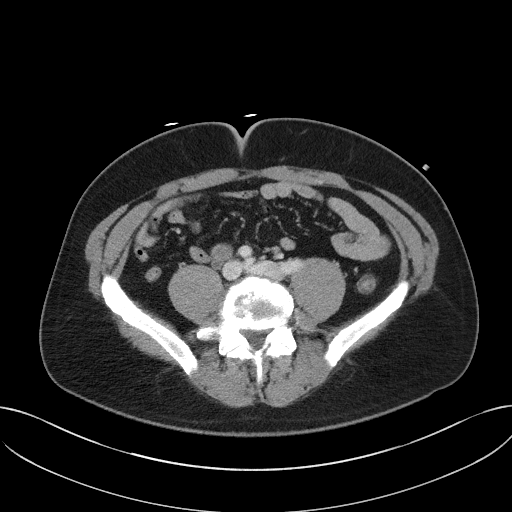
[im 49/98  soft-tissue]
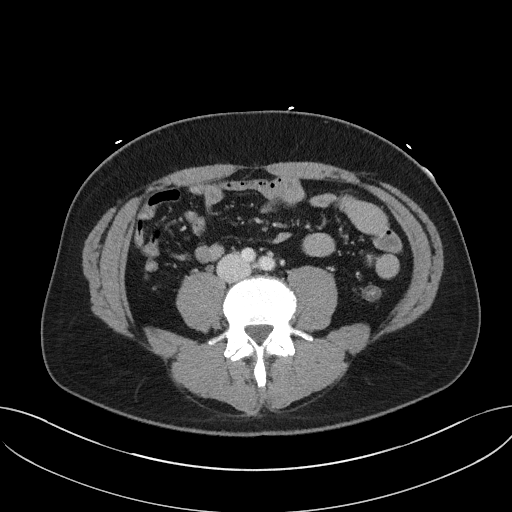
[im 54/98  soft-tissue]
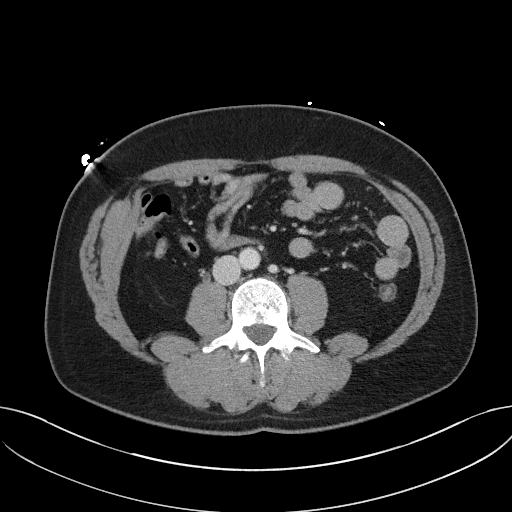
[im 65/98  soft-tissue]
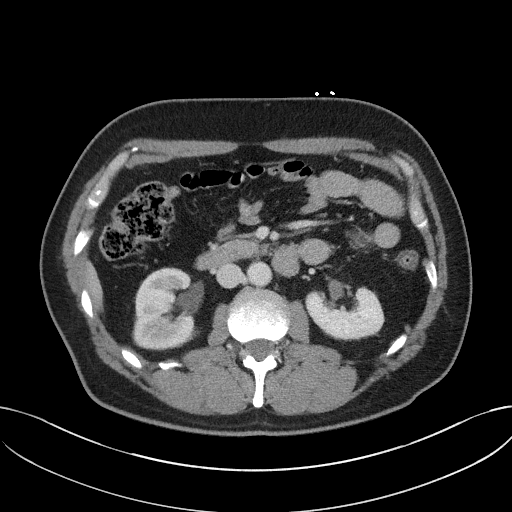
[im 65/98  bone]
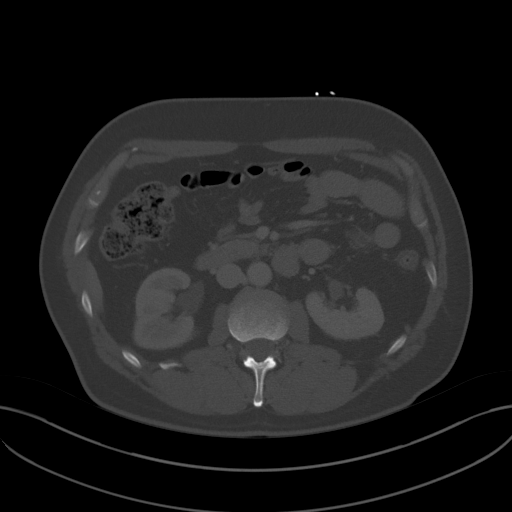
[im 71/98  soft-tissue]
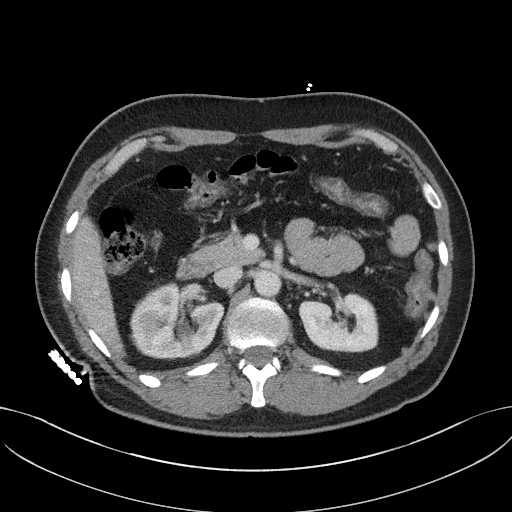
[im 76/98  soft-tissue]
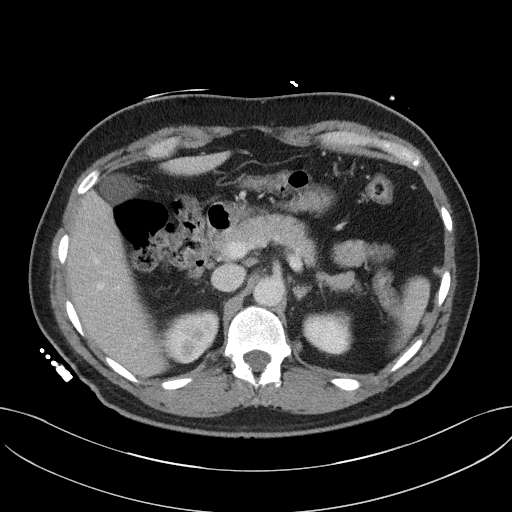
[im 87/98  soft-tissue]
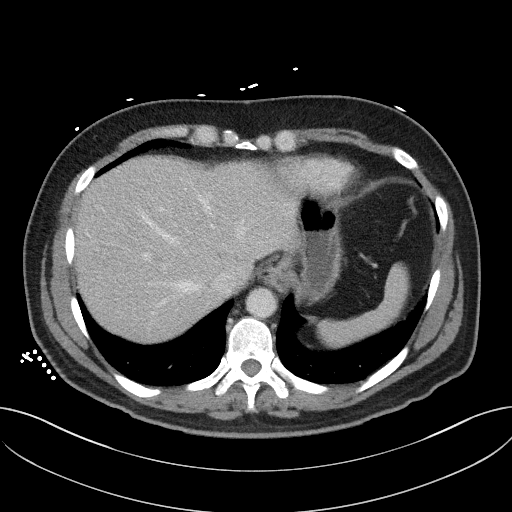
[im 92/98  soft-tissue]
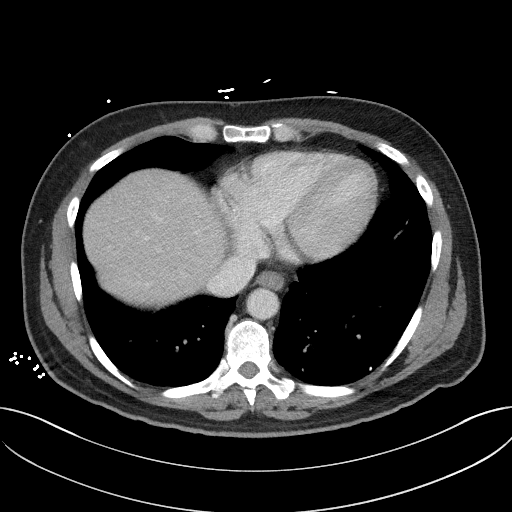

[Series 5: coronal st · coronal · 0.68mm/px · 3 of 101 slices shown]
[im 34/101  soft-tissue]
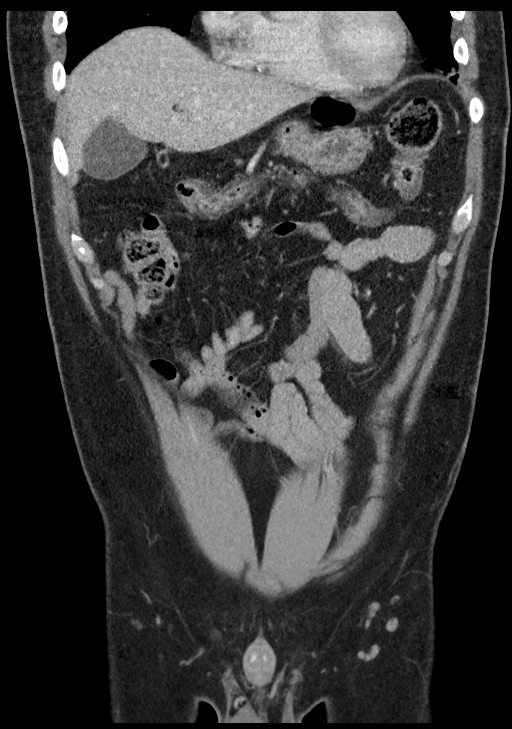
[im 45/101  soft-tissue]
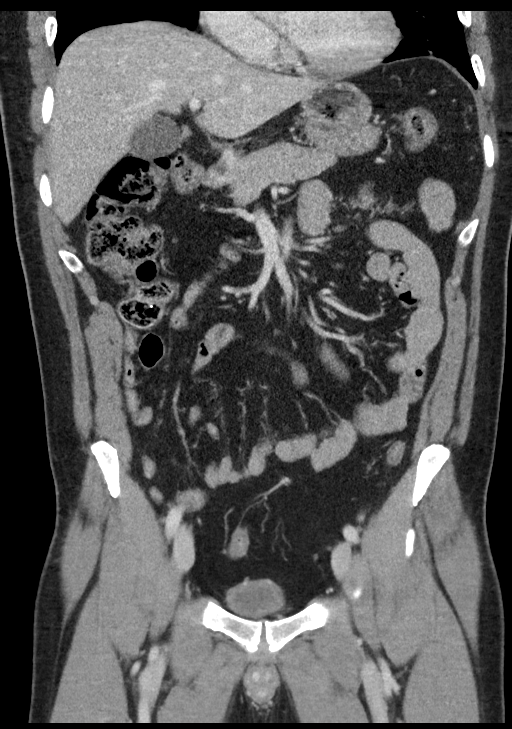
[im 56/101  soft-tissue]
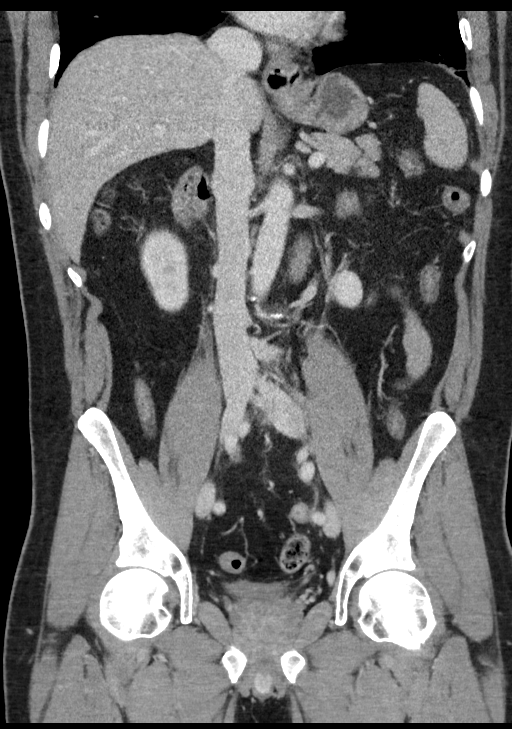

[16 of 46 positions shown; findings below may reference images not displayed]

FINDINGS: Lower chest: No acute pleural or parenchymal lung disease.

Hepatobiliary: No focal liver abnormality is seen. No gallstones,
gallbladder wall thickening, or biliary dilatation.

Pancreas: Unremarkable. No pancreatic ductal dilatation or
surrounding inflammatory changes.

Spleen: Normal in size without focal abnormality.

Adrenals/Urinary Tract: There is high-grade right-sided obstructive
uropathy related to a 3 mm distal right ureteral calculus, reference
image 76/2. This results in right-sided hydronephrosis and
hydroureter, with delayed excretion of contrast from the right
kidney.

The left kidney enhances normally. Bladder is decompressed, which
limits evaluation. The adrenals are normal.

Stomach/Bowel: No bowel obstruction or ileus. No bowel wall
thickening or inflammatory change.

Vascular/Lymphatic: Aortic atherosclerosis. No enlarged abdominal or
pelvic lymph nodes.

Reproductive: Prostate is unremarkable.

Other: No free fluid or free gas. No abdominal wall hernia.

Musculoskeletal: No acute or destructive bony lesions.
IMPRESSION: High-grade right-sided obstructive uropathy related to a 3 mm distal
right ureteral calculus.

## 2021-10-12 DIAGNOSIS — B078 Other viral warts: Secondary | ICD-10-CM | POA: Diagnosis not present

## 2021-12-29 DIAGNOSIS — Z0000 Encounter for general adult medical examination without abnormal findings: Secondary | ICD-10-CM | POA: Diagnosis not present

## 2021-12-29 DIAGNOSIS — Z125 Encounter for screening for malignant neoplasm of prostate: Secondary | ICD-10-CM | POA: Diagnosis not present

## 2021-12-29 DIAGNOSIS — Z13220 Encounter for screening for lipoid disorders: Secondary | ICD-10-CM | POA: Diagnosis not present

## 2022-01-05 DIAGNOSIS — J309 Allergic rhinitis, unspecified: Secondary | ICD-10-CM | POA: Diagnosis not present

## 2022-01-05 DIAGNOSIS — I10 Essential (primary) hypertension: Secondary | ICD-10-CM | POA: Diagnosis not present

## 2022-01-05 DIAGNOSIS — Z0000 Encounter for general adult medical examination without abnormal findings: Secondary | ICD-10-CM | POA: Diagnosis not present

## 2022-01-05 DIAGNOSIS — E7800 Pure hypercholesterolemia, unspecified: Secondary | ICD-10-CM | POA: Diagnosis not present

## 2022-04-20 DIAGNOSIS — J0100 Acute maxillary sinusitis, unspecified: Secondary | ICD-10-CM | POA: Diagnosis not present

## 2022-04-23 DIAGNOSIS — K080 Exfoliation of teeth due to systemic causes: Secondary | ICD-10-CM | POA: Diagnosis not present

## 2022-04-26 DIAGNOSIS — D2262 Melanocytic nevi of left upper limb, including shoulder: Secondary | ICD-10-CM | POA: Diagnosis not present

## 2022-04-26 DIAGNOSIS — B351 Tinea unguium: Secondary | ICD-10-CM | POA: Diagnosis not present

## 2022-04-26 DIAGNOSIS — D225 Melanocytic nevi of trunk: Secondary | ICD-10-CM | POA: Diagnosis not present

## 2022-05-03 DIAGNOSIS — K0262 Dental caries on smooth surface penetrating into dentin: Secondary | ICD-10-CM | POA: Diagnosis not present

## 2022-05-03 DIAGNOSIS — K0381 Cracked tooth: Secondary | ICD-10-CM | POA: Diagnosis not present

## 2022-05-04 DIAGNOSIS — K0262 Dental caries on smooth surface penetrating into dentin: Secondary | ICD-10-CM | POA: Diagnosis not present

## 2022-05-04 DIAGNOSIS — K0381 Cracked tooth: Secondary | ICD-10-CM | POA: Diagnosis not present

## 2023-03-17 DIAGNOSIS — Z125 Encounter for screening for malignant neoplasm of prostate: Secondary | ICD-10-CM | POA: Diagnosis not present

## 2023-03-17 DIAGNOSIS — Z13220 Encounter for screening for lipoid disorders: Secondary | ICD-10-CM | POA: Diagnosis not present

## 2023-03-17 DIAGNOSIS — Z0000 Encounter for general adult medical examination without abnormal findings: Secondary | ICD-10-CM | POA: Diagnosis not present

## 2023-03-23 DIAGNOSIS — E7800 Pure hypercholesterolemia, unspecified: Secondary | ICD-10-CM | POA: Diagnosis not present

## 2023-03-23 DIAGNOSIS — I10 Essential (primary) hypertension: Secondary | ICD-10-CM | POA: Diagnosis not present

## 2023-03-23 DIAGNOSIS — I959 Hypotension, unspecified: Secondary | ICD-10-CM | POA: Diagnosis not present

## 2023-03-23 DIAGNOSIS — J309 Allergic rhinitis, unspecified: Secondary | ICD-10-CM | POA: Diagnosis not present

## 2023-03-23 DIAGNOSIS — Z0000 Encounter for general adult medical examination without abnormal findings: Secondary | ICD-10-CM | POA: Diagnosis not present

## 2023-10-23 ENCOUNTER — Emergency Department (HOSPITAL_BASED_OUTPATIENT_CLINIC_OR_DEPARTMENT_OTHER)
Admission: EM | Admit: 2023-10-23 | Discharge: 2023-10-23 | Disposition: A | Discharge status: Home / Self Care | Attending: Emergency Medicine | Admitting: Emergency Medicine

## 2023-10-23 ENCOUNTER — Other Ambulatory Visit: Payer: Self-pay

## 2023-10-23 ENCOUNTER — Emergency Department (HOSPITAL_BASED_OUTPATIENT_CLINIC_OR_DEPARTMENT_OTHER)

## 2023-10-23 ENCOUNTER — Encounter (HOSPITAL_BASED_OUTPATIENT_CLINIC_OR_DEPARTMENT_OTHER): Payer: Self-pay | Admitting: Emergency Medicine

## 2023-10-23 DIAGNOSIS — U071 COVID-19: Secondary | ICD-10-CM | POA: Insufficient documentation

## 2023-10-23 DIAGNOSIS — R509 Fever, unspecified: Secondary | ICD-10-CM | POA: Diagnosis not present

## 2023-10-23 DIAGNOSIS — R059 Cough, unspecified: Secondary | ICD-10-CM | POA: Diagnosis not present

## 2023-10-23 LAB — RESP PANEL BY RT-PCR (RSV, FLU A&B, COVID)  RVPGX2
Influenza A by PCR: NEGATIVE
Influenza B by PCR: NEGATIVE
Resp Syncytial Virus by PCR: NEGATIVE
SARS Coronavirus 2 by RT PCR: POSITIVE — AB

## 2023-10-23 MED ORDER — DICYCLOMINE HCL 10 MG PO CAPS
10.0000 mg | ORAL_CAPSULE | Freq: Once | ORAL | Status: AC
  Administered 2023-10-23: 10 mg via ORAL
  Filled 2023-10-23: qty 1

## 2023-10-23 MED ORDER — DICYCLOMINE HCL 20 MG PO TABS: 20.0000 mg | ORAL_TABLET | Freq: Two times a day (BID) | ORAL | 0 refills | Status: AC

## 2023-10-23 MED ORDER — IBUPROFEN 400 MG PO TABS
400.0000 mg | ORAL_TABLET | Freq: Once | ORAL | Status: AC
  Administered 2023-10-23: 400 mg via ORAL
  Filled 2023-10-23: qty 1

## 2023-10-23 NOTE — ED Notes (Signed)
 Called to assess patient. Patient symptoms started last evening. Patient has upper airway congestion. BBS = , Rt base diminished. Ox sat 97%. RR 18.Covid test   and X-ray ordered.

## 2023-10-23 NOTE — Discharge Instructions (Addendum)
 You tested positive for COVID-19.  Your chest x-ray was clear.  You can take Motrin , 400 mg every 6 hours.  You can take Tylenol, 1000 mg every 8 hours.  You can take Bentyl  2 times per day for abdominal pain discomfort.  Follow-up with your primary care doctor.  Symptoms should begin to improve over the next 24 to 48 hours.

## 2023-10-23 NOTE — ED Provider Notes (Signed)
 [place] [facility] Provider Note   CSN: [id number] Arrival date & time: 10/23/23  1954     Patient presents with: Cough and Fever   KOHL POLINSKY is a 63 y.o. male.   Healthy 63 year old male here today with fever, cough, congestion.  Symptoms began yesterday.  Patient otherwise healthy.   Cough Associated symptoms: fever   Fever Associated symptoms: cough        Prior to Admission medications   Medication Sig Start Date End Date Taking? Authorizing Provider  dicyclomine  (BENTYL ) 20 MG tablet Take 1 tablet (20 mg total) by mouth 2 (two) times daily. 10/23/23  Yes Mannie Fairy DASEN, DO  fluticasone OREN) 50 MCG/ACT nasal spray  05/10/18   [provider]  hydrochlorothiazide (HYDRODIURIL) 25 MG tablet  09/14/18   [provider]  levocetirizine (XYZAL) 5 MG tablet  09/18/18   [provider]  losartan (COZAAR) 100 MG tablet  09/14/18   [provider]  ondansetron  (ZOFRAN  ODT) 8 MG disintegrating tablet Take 1 tablet (8 mg total) by mouth every 8 (eight) hours as needed for nausea or vomiting. 10/28/18   Randol Simmonds, MD    Allergies: Penicillins    Review of Systems  Constitutional:  Positive for fever.  Respiratory:  Positive for cough.     Updated Vital Signs BP 102/84 (BP Location: Right Arm)   Pulse 96   Temp 100.1 F (37.8 C) (Oral)   Resp 20   Ht 5' 8 (1.727 m)   Wt 81.6 kg   SpO2 96%   BMI 27.37 kg/m   Physical Exam Vitals and nursing note reviewed.  Constitutional:      Appearance: He is not toxic-appearing.  HENT:     Head: Normocephalic.  Cardiovascular:     Rate and Rhythm: Normal rate.  Pulmonary:     Effort: Pulmonary effort is normal.     Breath sounds: No wheezing.  Abdominal:     General: Abdomen is flat.  Musculoskeletal:     Cervical back: Normal range of motion.     (all labs ordered are listed, but only abnormal results are displayed) Labs Reviewed   RESP PANEL BY RT-PCR (RSV, FLU A&B, COVID)  RVPGX2 - Abnormal; Notable for the following components:      Result Value   SARS Coronavirus 2 by RT PCR POSITIVE (*)    All other components within normal limits    EKG: None  Radiology: DG Chest 2 View Result Date: 10/23/2023 CLINICAL DATA:  Cough EXAM: CHEST - 2 VIEW COMPARISON:  None Available. FINDINGS: The heart size and mediastinal contours are within normal limits. Both lungs are clear. The visualized skeletal structures are unremarkable. IMPRESSION: No active cardiopulmonary disease. Electronically Signed   By: Dorethia Molt M.D.   On: 10/23/2023 21:09     Procedures   Medications Ordered in the ED  ibuprofen  (ADVIL ) tablet 400 mg (has no administration in time range)  dicyclomine  (BENTYL ) capsule 10 mg (has no administration in time range)                                    Medical Decision Making She is a 58-year-old male here today with fever, cough, congestion.  Also endorses being gassy.  Plan-patient overall looks well.  He is clear lungs.  Vital signs overall normal.  Counseled on using Motrin , Tylenol.  No  indication for Paxlovid given his lack of comorbidities.  Will give him some Bentyl  here in the ED due to gassiness and occasional cramping he has been having.  Soft abdomen.  Reviewed the patient's most recent outpatient office note.  Considered admission for this patient, however he does not meet admission criteria and is appropriate for discharge.  Amount and/or Complexity of Data Reviewed Radiology: ordered.  Risk Prescription drug management.        Final diagnoses:  COVID    ED Discharge Orders          Ordered    dicyclomine  (BENTYL ) 20 MG tablet  2 times daily        10/23/23 2142               Mannie Pac T, DO 10/23/23 2142

## 2023-10-23 NOTE — ED Triage Notes (Addendum)
 Pt reports cough, congestion, fatigue, fever (104 F at 1930 - took 2 Tylenol), chills, mid ABD pain  Denies n/v/d; RT called to assess, denies any Resp hx

## 2024-04-12 ENCOUNTER — Other Ambulatory Visit: Payer: Self-pay

## 2024-04-12 ENCOUNTER — Encounter (HOSPITAL_COMMUNITY): Payer: Self-pay

## 2024-04-12 ENCOUNTER — Other Ambulatory Visit: Payer: Self-pay | Admitting: Gastroenterology

## 2024-04-12 ENCOUNTER — Observation Stay (HOSPITAL_COMMUNITY)
Admission: EM | Admit: 2024-04-12 | Discharge: 2024-04-13 | Disposition: A | Discharge status: Home / Self Care | Source: Ambulatory Visit | Attending: Internal Medicine | Admitting: Internal Medicine

## 2024-04-12 ENCOUNTER — Ambulatory Visit
Admission: RE | Admit: 2024-04-12 | Discharge: 2024-04-12 | Disposition: A | Discharge status: Home / Self Care | Source: Ambulatory Visit | Attending: Gastroenterology | Admitting: Gastroenterology

## 2024-04-12 DIAGNOSIS — R109 Unspecified abdominal pain: Secondary | ICD-10-CM

## 2024-04-12 DIAGNOSIS — M25512 Pain in left shoulder: Secondary | ICD-10-CM | POA: Insufficient documentation

## 2024-04-12 DIAGNOSIS — Z79899 Other long term (current) drug therapy: Secondary | ICD-10-CM | POA: Diagnosis not present

## 2024-04-12 DIAGNOSIS — Y838 Other surgical procedures as the cause of abnormal reaction of the patient, or of later complication, without mention of misadventure at the time of the procedure: Secondary | ICD-10-CM | POA: Insufficient documentation

## 2024-04-12 DIAGNOSIS — S36039A Unspecified laceration of spleen, initial encounter: Secondary | ICD-10-CM | POA: Diagnosis not present

## 2024-04-12 DIAGNOSIS — K661 Hemoperitoneum: Secondary | ICD-10-CM

## 2024-04-12 DIAGNOSIS — G8929 Other chronic pain: Secondary | ICD-10-CM

## 2024-04-12 DIAGNOSIS — S3600XA Unspecified injury of spleen, initial encounter: Principal | ICD-10-CM | POA: Diagnosis present

## 2024-04-12 DIAGNOSIS — I10 Essential (primary) hypertension: Secondary | ICD-10-CM | POA: Diagnosis present

## 2024-04-12 LAB — COMPREHENSIVE METABOLIC PANEL WITH GFR
ALT: 26 U/L (ref 0–44)
AST: 28 U/L (ref 15–41)
Albumin: 4 g/dL (ref 3.5–5.0)
Alkaline Phosphatase: 53 U/L (ref 38–126)
Anion gap: 6 (ref 5–15)
BUN: 7 mg/dL — ABNORMAL LOW (ref 8–23)
CO2: 32 mmol/L (ref 22–32)
Calcium: 9.6 mg/dL (ref 8.9–10.3)
Chloride: 104 mmol/L (ref 98–111)
Creatinine, Ser: 0.97 mg/dL (ref 0.61–1.24)
GFR, Estimated: >60 mL/min
Glucose, Bld: 83 mg/dL (ref 70–99)
Potassium: 3.8 mmol/L (ref 3.5–5.1)
Sodium: 142 mmol/L (ref 135–145)
Total Bilirubin: 0.6 mg/dL (ref 0.0–1.2)
Total Protein: 6.5 g/dL (ref 6.5–8.1)

## 2024-04-12 LAB — URINALYSIS, ROUTINE W REFLEX MICROSCOPIC
Bilirubin Urine: NEGATIVE
Glucose, UA: NEGATIVE mg/dL
Hgb urine dipstick: NEGATIVE
Ketones, ur: NEGATIVE mg/dL
Leukocytes,Ua: NEGATIVE
Nitrite: NEGATIVE
Protein, ur: NEGATIVE mg/dL
Specific Gravity, Urine: >1.046 — ABNORMAL HIGH (ref 1.005–1.030)
pH: 6 (ref 5.0–8.0)

## 2024-04-12 LAB — CBC WITH DIFFERENTIAL/PLATELET
Abs Immature Granulocytes: 0.02 10*3/uL (ref 0.00–0.07)
Basophils Absolute: 0 10*3/uL (ref 0.0–0.1)
Basophils Relative: 0 %
Eosinophils Absolute: 0.2 10*3/uL (ref 0.0–0.5)
Eosinophils Relative: 2 %
HCT: 38.3 % — ABNORMAL LOW (ref 39.0–52.0)
Hemoglobin: 13.2 g/dL (ref 13.0–17.0)
Immature Granulocytes: 0 %
Lymphocytes Relative: 20 %
Lymphs Abs: 1.8 10*3/uL (ref 0.7–4.0)
MCH: 31.9 pg (ref 26.0–34.0)
MCHC: 34.5 g/dL (ref 30.0–36.0)
MCV: 92.5 fL (ref 80.0–100.0)
Monocytes Absolute: 0.8 10*3/uL (ref 0.1–1.0)
Monocytes Relative: 9 %
Neutro Abs: 6.2 10*3/uL (ref 1.7–7.7)
Neutrophils Relative %: 69 %
Platelets: 173 10*3/uL (ref 150–400)
RBC: 4.14 MIL/uL — ABNORMAL LOW (ref 4.22–5.81)
RDW: 11.6 % (ref 11.5–15.5)
WBC: 8.9 10*3/uL (ref 4.0–10.5)
nRBC: 0 % (ref 0.0–0.2)

## 2024-04-12 LAB — TROPONIN T, HIGH SENSITIVITY
Troponin T High Sensitivity: 7 ng/L (ref 0–19)
Troponin T High Sensitivity: 7 ng/L (ref 0–19)

## 2024-04-12 LAB — LIPASE, BLOOD: Lipase: 32 U/L (ref 11–51)

## 2024-04-12 MED ORDER — ACETAMINOPHEN 325 MG PO TABS: 650.0000 mg | ORAL_TABLET | Freq: Four times a day (QID) | ORAL | Status: DC | PRN

## 2024-04-12 MED ORDER — SODIUM CHLORIDE 0.9 % IV SOLN: INTRAVENOUS | Status: DC

## 2024-04-12 MED ORDER — MORPHINE SULFATE (PF) 4 MG/ML IV SOLN: 4.0000 mg | Freq: Once | INTRAVENOUS | Status: DC | PRN

## 2024-04-12 MED ORDER — SODIUM CHLORIDE 0.9% FLUSH: 3.0000 mL | INTRAVENOUS | Status: DC | PRN

## 2024-04-12 MED ORDER — IOPAMIDOL (ISOVUE-300) INJECTION 61%
100.0000 mL | Freq: Once | INTRAVENOUS | Status: AC | PRN
  Administered 2024-04-12: 100 mL via INTRAVENOUS

## 2024-04-12 MED ORDER — HYDROCODONE-ACETAMINOPHEN 5-325 MG PO TABS: 1.0000 | ORAL_TABLET | ORAL | Status: DC | PRN

## 2024-04-12 MED ORDER — LACTATED RINGERS IV BOLUS
1000.0000 mL | Freq: Once | INTRAVENOUS | Status: AC
  Administered 2024-04-12: 1000 mL via INTRAVENOUS

## 2024-04-12 MED ORDER — FLUTICASONE PROPIONATE 50 MCG/ACT NA SUSP
1.0000 | Freq: Every day | NASAL | Status: DC
  Filled 2024-04-12: qty 16

## 2024-04-12 MED ORDER — SODIUM CHLORIDE 0.9% FLUSH
3.0000 mL | Freq: Two times a day (BID) | INTRAVENOUS | Status: DC
  Administered 2024-04-12: 3 mL via INTRAVENOUS

## 2024-04-12 MED ORDER — SODIUM CHLORIDE 0.9 % IV SOLN: 250.0000 mL | INTRAVENOUS | Status: DC | PRN

## 2024-04-12 MED ORDER — POLYETHYLENE GLYCOL 3350 17 G PO PACK: 17.0000 g | PACK | Freq: Every day | ORAL | Status: DC | PRN

## 2024-04-12 MED ORDER — ACETAMINOPHEN 650 MG RE SUPP: 650.0000 mg | Freq: Four times a day (QID) | RECTAL | Status: DC | PRN

## 2024-04-12 MED ORDER — ONDANSETRON HCL 4 MG/2ML IJ SOLN: 4.0000 mg | Freq: Once | INTRAMUSCULAR | Status: DC | PRN

## 2024-04-12 MED ORDER — HYDRALAZINE HCL 20 MG/ML IJ SOLN: 10.0000 mg | Freq: Four times a day (QID) | INTRAMUSCULAR | Status: DC | PRN

## 2024-04-12 NOTE — ED Provider Notes (Signed)
 " [place] [facility] AT [facility] Provider Note   CSN: [id number] Arrival date & time: 04/12/24  1312     Patient presents with: Abdominal Pain and Shoulder Pain   Nicholas Shields is a 64 y.o. male.    Abdominal Pain Shoulder Pain    Patient had a colonoscopy procedure yesterday.  Patient was told it was a difficult procedure.  Patient did experience pain in his abdomen after the procedure.  This morning he called his doctor and reported having some pain in his left upper quadrant with some shortness of breath.  An outpatient CT scan was ordered.  The patient CT scan showed high density fluid along the lateral spleen and adjacent lateral abdominal wall favoring blood products from occult splenic injury.  Patient also had a small volume perihepatic hemoperitoneum without focal hepatic laceration.  Patient also had pelvic hemoperitoneum with about 180 cc of complex fluid in the pelvis.  Patient was instructed to come to the ED to be admitted to the hospital for observation.  Patient states his symptoms fortunately have been improving.  He is not really having any significant pain or discomfort right now  Prior to Admission medications  Medication Sig Start Date End Date Taking? Authorizing Provider  dicyclomine  (BENTYL ) 20 MG tablet Take 1 tablet (20 mg total) by mouth 2 (two) times daily. 10/23/23   Mannie Fairy DASEN, DO  fluticasone  (FLONASE ) 50 MCG/ACT nasal spray  05/10/18   [provider]  hydrochlorothiazide (HYDRODIURIL) 25 MG tablet  09/14/18   [provider]  levocetirizine (XYZAL) 5 MG tablet  09/18/18   [provider]  losartan (COZAAR) 100 MG tablet  09/14/18   [provider]  ondansetron  (ZOFRAN  ODT) 8 MG disintegrating tablet Take 1 tablet (8 mg total) by mouth every 8 (eight) hours as needed for nausea or vomiting. 10/28/18   Randol Simmonds, MD    Allergies: Penicillins    Review of Systems  Gastrointestinal:   Positive for abdominal pain.    Updated Vital Signs BP 123/62 (BP Location: Left Arm)   Pulse 63   Temp 98.3 F (36.8 C) (Oral)   Resp 16   Ht 1.727 m (5' 8)   Wt 81.6 kg   SpO2 96%   BMI 27.35 kg/m   Physical Exam Vitals and nursing note reviewed.  Constitutional:      General: He is not in acute distress.    Appearance: He is well-developed.  HENT:     Head: Normocephalic and atraumatic.     Right Ear: External ear normal.     Left Ear: External ear normal.  Eyes:     General: No scleral icterus.       Right eye: No discharge.        Left eye: No discharge.     Conjunctiva/sclera: Conjunctivae normal.  Neck:     Trachea: No tracheal deviation.  Cardiovascular:     Rate and Rhythm: Normal rate and regular rhythm.  Pulmonary:     Effort: Pulmonary effort is normal. No respiratory distress.     Breath sounds: Normal breath sounds. No stridor. No wheezing or rales.  Abdominal:     General: Bowel sounds are normal. There is no distension.     Palpations: Abdomen is soft.     Tenderness: There is abdominal tenderness in the left upper quadrant. There is no guarding or rebound.  Musculoskeletal:        General: No tenderness or  deformity.     Cervical back: Neck supple.  Skin:    General: Skin is warm and dry.     Findings: No rash.  Neurological:     General: No focal deficit present.     Mental Status: He is alert.     Cranial Nerves: No cranial nerve deficit, dysarthria or facial asymmetry.     Sensory: No sensory deficit.     Motor: No abnormal muscle tone or seizure activity.     Coordination: Coordination normal.  Psychiatric:        Mood and Affect: Mood normal.     (all labs ordered are listed, but only abnormal results are displayed) Labs Reviewed  COMPREHENSIVE METABOLIC PANEL WITH GFR - Abnormal; Notable for the following components:      Result Value   BUN 7 (*)    All other components within normal limits  CBC WITH DIFFERENTIAL/PLATELET -  Abnormal; Notable for the following components:   RBC 4.14 (*)    HCT 38.3 (*)    All other components within normal limits  URINALYSIS, ROUTINE W REFLEX MICROSCOPIC - Abnormal; Notable for the following components:   Specific Gravity, Urine >1.046 (*)    All other components within normal limits  LIPASE, BLOOD  TROPONIN T, HIGH SENSITIVITY  TROPONIN T, HIGH SENSITIVITY    EKG: None  Radiology: CT ABDOMEN PELVIS W CONTRAST Result Date: 04/12/2024 EXAM: CT ABDOMEN AND PELVIS WITH CONTRAST 04/12/2024 10:36:39 AM TECHNIQUE: CT of the abdomen and pelvis was performed with the administration of 100 cc isovue  300 intravenous contrast. Multiplanar reformatted images are provided for review. Automated exposure control, iterative reconstruction, and/or weight-based adjustment of the mA/kV was utilized to reduce the radiation dose to as low as reasonably achievable. COMPARISON: None available. CLINICAL HISTORY: acute left abdominal pain, assessment for splenic injury. FINDINGS: LOWER CHEST: Atelectasis in the left lower lobe and lingula along the left hemidiaphragm. LIVER: There is a small amount of perihepatic complex fluid favoring a small amount of perihepatic hemoperitoneum, without a well defined focal hepatic laceration. GALLBLADDER AND BILE DUCTS: Gallbladder is unremarkable. No biliary ductal dilatation. SPLEEN: Abnormal high density fluid favoring hematoma along the lateral spleen and tracking along the adjacent lateral abdominal wall deep to the left 8th and 9th ribs, without a well delineated rib fracture. Appearance favors local hematoma and a component of subcapsular hematoma along the spleen is not excluded. A well defined focal splenic laceration is not identified. The thickness of the lateral fluid is about 2 cm on image 10 series 2. PANCREAS: No acute abnormality. ADRENAL GLANDS: No acute abnormality. KIDNEYS, URETERS AND BLADDER: Mild prostatomegaly. No stones in the kidneys or ureters. No  hydronephrosis. No perinephric or periureteral stranding. Urinary bladder is unremarkable. GI AND BOWEL: Fluid filled descending colon with adjacent small amount of hemoperitoneum while in the left paracolic gutter, appearance is not considered highly specific for bowel injury but the possibility of bowel injury should be considered if the patient's clinical course is unusual or suggestive. Narrowed segments of the transverse colon are probably due to peristaltic activity, but correlation with the patient's colon cancer screening history is recommended. Stomach demonstrates no acute abnormality. There is no bowel obstruction. PERITONEUM AND RETROPERITONEUM: There is about 180 cc of mildly complex fluid in the pelvis compatible with pelvic hemoperitoneum. No source of active bleeding is identified. No free air. VASCULATURE: Abdominal aortic atheromatous vascular calcifications. Aorta is normal in caliber. LYMPH NODES: No lymphadenopathy. REPRODUCTIVE ORGANS: No acute abnormality.  BONES AND SOFT TISSUES: No acute osseous abnormality. No focal soft tissue abnormality. IMPRESSION: 1. High density fluid along the lateral spleen and adjacent lateral abdominal wall, favoring blood products likely from otherwise occult splenic injury; a component of subcapsular splenic hematoma is not excluded. 2. Small volume perihepatic hemoperitoneum without a focal hepatic laceration. 3. Pelvic hemoperitoneum, with about 180 cc of complex fluid in the pelvis . 4. Fluid-filled descending colon with small amount of adjacent hemoperitoneum in the left paracolic gutter. Specific indicators of bowel injury not identified, but consider bowel injury if the clinical course is unusual or suggestive. 5. No source of active bleeding is identified. 6. Mild prostatomegaly. 7. Abdominal aortic atherosclerotic calcifications. Electronically signed by: Ryan Salvage MD 04/12/2024 11:47 AM EST RP Workstation: [id number]     Procedures    Medications Ordered in the ED  morphine  (PF) 4 MG/ML injection 4 mg (has no administration in time range)  ondansetron  (ZOFRAN ) injection 4 mg (has no administration in time range)  lactated ringers  bolus 1,000 mL (1,000 mLs Intravenous New Bag/Given 04/12/24 1930)    Clinical Course as of 04/12/24 2031  Thu Apr 12, 2024  1929 Case discussed with Dr Vanderbilt.  Will see pt in consultation.  Recommend serial hgb [JK]  1952 Case discussed with Dr. Saintclair.  GI will see patient in the morning.  Agree with continued serial hemoglobin.  Hopefully patient can be discharged tomorrow [JK]  2029 Case discussed with Dr Sonjia regarding admission [JK]    Clinical Course User Index [JK] Randol Simmonds, MD                                 Medical Decision Making Problems Addressed: Hemoperitoneum: acute illness or injury that poses a threat to life or bodily functions Injury of spleen, initial encounter: acute illness or injury that poses a threat to life or bodily functions  Amount and/or Complexity of Data Reviewed Labs: ordered. Decision-making details documented in ED Course. Radiology: independent interpretation performed.  Risk Prescription drug management. Decision regarding hospitalization.   Patient's outpatient CT scan showed findings concerning for splenic injury after a colonoscopy.  No definite bowel injury noted.  Patient's laboratory test do not show any signs of anemia.  No significant electrolyte abnormalities.    Patient was sent to the ED to be admitted for observation.  Patient is currently not having any significant pain.  He was able to eat and drink earlier today.  I will consult with [facility] gastroenterology as well as general surgery.     Final diagnoses:  Injury of spleen, initial encounter  Hemoperitoneum    ED Discharge Orders     None          Randol Simmonds, MD 04/12/24 2031  "

## 2024-04-12 NOTE — ED Provider Triage Note (Signed)
 Emergency Medicine Provider Triage Evaluation Note  Nicholas Shields , Shields 64 y.o. male  was evaluated in triage.  Pt complains of abdominal pain and shoulder pain.  Reportedly had Shields difficult colonoscopy yesterday and has continued to have pain towards the left shoulder.  No reported shortness of breath but does report some pain with deep and elation.  He does not have any central chest pain but reports some pain towards the anterior left shoulder.  Had nausea vomiting yesterday but this is resolved.  Review of Systems  Positive: As above Negative: As above  Physical Exam  BP (!) 140/81 (BP Location: Left Arm)   Pulse 75   Temp 98.9 F (37.2 C)   Resp 18   SpO2 100%  Gen:   Awake, no distress   Resp:  Normal effort, no wheezing, rales, or rhonchi. MSK:   Moves extremities without difficulty  Other:    Medical Decision Making  Medically screening exam initiated at 2:05 PM.  Appropriate orders placed.  Nicholas Shields was informed that the remainder of the evaluation will be completed by another provider, this initial triage assessment does not replace that evaluation, and the importance of remaining in the ED until their evaluation is complete.     Nicholas Kyne A, PA-C 04/12/24 1406

## 2024-04-12 NOTE — H&P (Addendum)
 " [facility] History and Physical  Nicholas Shields FMW:[id number] DOB: 12/11/1960 DOA: 04/12/2024  Referring physician: ED  PCP: [name], MD   Patient is coming from: Home  Chief Complaint: Abdominal and left shoulder pain  HPI:  Patient is a 64 years old male with past medical history of hypertension who had undergone colonoscopy yesterday by [name] presented to hospital with abdominal pain radiating to the left shoulder.  Patient has had the procedure yesterday which was difficult and subsequently experienced some pain in the abdomen after the procedure.  This persisted so he called his GI doctor in the morning and a CT scan was ordered as outpatient which showed a high density fluid along the lateral spleen and abdominal wall from possible occult splenic injury with some perihepatic and pelvic Hemi peritoneum.  Patient was then advised to come to the hospital for further observation.  In the ED patient did not complain of significant pain or discomfort but had left shoulder pain worse on deep inspiration around 2/ 10 in intensity.  Denies any nausea vomiting diarrhea but had mild abdominal bloating..  Denies any shortness of breath dyspnea or fever.  Denies any urinary urgency, frequency or dysuria.  Denies any dizziness, lightheadedness or syncope.  In the ED, vitals were notable for blood pressure of 140/ 81.  No tachycardia.  Patient was afebrile.  Labs were notable for hemoglobin of 13.2 with WBC at 8.9.  Lipase of 32. BMP essentially within normal range.  Urinalysis was negative.  Patient was then considered for observation in the hospital.  Assessment and Plan Principal Problem:   Spleen injury Active Problems:   Hypertension  Abdominal pain /left shoulder pain status post colonoscopy with  splenic injury.  Has a pelvic and perihepatic hemoperitoneum as well.  Symptoms are improving.  GI and general surgery Dr Vanderbilt were consulted and at this time plan is  conservative treatment with observation with serial hematocrit..  Will continue to monitor hematocrit q 6 hrly.  Continue analgesia, antiemetics as necessary.  Continue with gentle hydration.  History of hypertension. On HCTZ and losartan as outpatient.  Continue IV hydralazine  for now.  DVT Prophylaxis: SCD  Review of Systems:  All systems were reviewed and were negative unless otherwise mentioned in the HPI   Past Medical History:  Diagnosis Date   Hypertension    Past Surgical History:  Procedure Laterality Date   BACK SURGERY      Social History:  reports that he has never smoked. He has never used smokeless tobacco. He reports that he does not drink alcohol and does not use drugs.  Allergies[1]  History reviewed. No pertinent family history.   Prior to Admission medications  Medication Sig Start Date End Date Taking? Authorizing Provider  dicyclomine  (BENTYL ) 20 MG tablet Take 1 tablet (20 mg total) by mouth 2 (two) times daily. 10/23/23   Mannie Fairy DASEN, DO  fluticasone  (FLONASE ) 50 MCG/ACT nasal spray  05/10/18   [provider]  hydrochlorothiazide (HYDRODIURIL) 25 MG tablet  09/14/18   [provider]  levocetirizine (XYZAL) 5 MG tablet  09/18/18   [provider]  losartan (COZAAR) 100 MG tablet  09/14/18   [provider]  ondansetron  (ZOFRAN  ODT) 8 MG disintegrating tablet Take 1 tablet (8 mg total) by mouth every 8 (eight) hours as needed for nausea or vomiting. 10/28/18   Randol Simmonds, MD    Physical Exam:  Vitals:   04/12/24 1332 04/12/24 1846 04/12/24  1934  BP: (!) 140/81 123/64 123/62  Pulse: 75 65 63  Resp: 18 16 16   Temp: 98.9 F (37.2 C) 98.1 F (36.7 C) 98.3 F (36.8 C)  TempSrc:  Oral Oral  SpO2: 100% 96% 96%  Weight:   81.6 kg  Height:   5' 8 (1.727 m)   Wt Readings from Last 3 Encounters:  04/12/24 81.6 kg  10/23/23 81.6 kg  02/12/20 82.6 kg   Body mass index is 27.35 kg/m.  General:  Average built, not  in obvious distress HENT: Normocephalic, No scleral pallor or icterus noted. Oral mucosa is moist.  Chest:  Clear breath sounds.  . No crackles or wheezes.  CVS: S1 &S2 heard. No murmur.  Regular rate and rhythm. Abdomen: Soft, minimal to no tenderness over the left upper quadrant, nondistended.  Bowel sounds are heard. No abdominal mass palpated Extremities: No cyanosis, clubbing or edema.  Peripheral pulses are palpable. Psych: Alert, awake and oriented, normal mood CNS:  No cranial nerve deficits.  Power equal in all extremities.   Skin: Warm and dry.  No rashes noted.  Labs on Admission:   CBC: Recent Labs  Lab 04/12/24 1338  WBC 8.9  NEUTROABS 6.2  HGB 13.2  HCT 38.3*  MCV 92.5  PLT 173    Basic Metabolic Panel: Recent Labs  Lab 04/12/24 1338  NA 142  K 3.8  CL 104  CO2 32  GLUCOSE 83  BUN 7*  CREATININE 0.97  CALCIUM 9.6    Liver Function Tests: Recent Labs  Lab 04/12/24 1338  AST 28  ALT 26  ALKPHOS 53  BILITOT 0.6  PROT 6.5  ALBUMIN 4.0   Recent Labs  Lab 04/12/24 1338  LIPASE 32   No results for input(s): AMMONIA in the last 168 hours.  Cardiac Enzymes: No results for input(s): CKTOTAL, CKMB, CKMBINDEX, TROPONINI in the last 168 hours.  BNP (last 3 results) No results for input(s): BNP in the last 8760 hours.  ProBNP (last 3 results) No results for input(s): PROBNP in the last 8760 hours.  CBG: No results for input(s): GLUCAP in the last 168 hours.  Lipase     Component Value Date/Time   LIPASE 32 04/12/2024 1338     Urinalysis    Component Value Date/Time   COLORURINE YELLOW 04/12/2024 1600   APPEARANCEUR CLEAR 04/12/2024 1600   LABSPEC >1.046 (H) 04/12/2024 1600   PHURINE 6.0 04/12/2024 1600   GLUCOSEU NEGATIVE 04/12/2024 1600   HGBUR NEGATIVE 04/12/2024 1600   BILIRUBINUR NEGATIVE 04/12/2024 1600   KETONESUR NEGATIVE 04/12/2024 1600   PROTEINUR NEGATIVE 04/12/2024 1600   NITRITE NEGATIVE 04/12/2024 1600    LEUKOCYTESUR NEGATIVE 04/12/2024 1600     Drugs of Abuse  No results found for: LABOPIA, COCAINSCRNUR, LABBENZ, AMPHETMU, THCU, LABBARB    Radiological Exams on Admission: CT ABDOMEN PELVIS W CONTRAST Result Date: 04/12/2024 EXAM: CT ABDOMEN AND PELVIS WITH CONTRAST 04/12/2024 10:36:39 AM TECHNIQUE: CT of the abdomen and pelvis was performed with the administration of 100 cc isovue  300 intravenous contrast. Multiplanar reformatted images are provided for review. Automated exposure control, iterative reconstruction, and/or weight-based adjustment of the mA/kV was utilized to reduce the radiation dose to as low as reasonably achievable. COMPARISON: None available. CLINICAL HISTORY: acute left abdominal pain, assessment for splenic injury. FINDINGS: LOWER CHEST: Atelectasis in the left lower lobe and lingula along the left hemidiaphragm. LIVER: There is a small amount of perihepatic complex fluid favoring a small amount of perihepatic hemoperitoneum,  without a well defined focal hepatic laceration. GALLBLADDER AND BILE DUCTS: Gallbladder is unremarkable. No biliary ductal dilatation. SPLEEN: Abnormal high density fluid favoring hematoma along the lateral spleen and tracking along the adjacent lateral abdominal wall deep to the left 8th and 9th ribs, without a well delineated rib fracture. Appearance favors local hematoma and a component of subcapsular hematoma along the spleen is not excluded. A well defined focal splenic laceration is not identified. The thickness of the lateral fluid is about 2 cm on image 10 series 2. PANCREAS: No acute abnormality. ADRENAL GLANDS: No acute abnormality. KIDNEYS, URETERS AND BLADDER: Mild prostatomegaly. No stones in the kidneys or ureters. No hydronephrosis. No perinephric or periureteral stranding. Urinary bladder is unremarkable. GI AND BOWEL: Fluid filled descending colon with adjacent small amount of hemoperitoneum while in the left paracolic gutter,  appearance is not considered highly specific for bowel injury but the possibility of bowel injury should be considered if the patient's clinical course is unusual or suggestive. Narrowed segments of the transverse colon are probably due to peristaltic activity, but correlation with the patient's colon cancer screening history is recommended. Stomach demonstrates no acute abnormality. There is no bowel obstruction. PERITONEUM AND RETROPERITONEUM: There is about 180 cc of mildly complex fluid in the pelvis compatible with pelvic hemoperitoneum. No source of active bleeding is identified. No free air. VASCULATURE: Abdominal aortic atheromatous vascular calcifications. Aorta is normal in caliber. LYMPH NODES: No lymphadenopathy. REPRODUCTIVE ORGANS: No acute abnormality. BONES AND SOFT TISSUES: No acute osseous abnormality. No focal soft tissue abnormality. IMPRESSION: 1. High density fluid along the lateral spleen and adjacent lateral abdominal wall, favoring blood products likely from otherwise occult splenic injury; a component of subcapsular splenic hematoma is not excluded. 2. Small volume perihepatic hemoperitoneum without a focal hepatic laceration. 3. Pelvic hemoperitoneum, with about 180 cc of complex fluid in the pelvis . 4. Fluid-filled descending colon with small amount of adjacent hemoperitoneum in the left paracolic gutter. Specific indicators of bowel injury not identified, but consider bowel injury if the clinical course is unusual or suggestive. 5. No source of active bleeding is identified. 6. Mild prostatomegaly. 7. Abdominal aortic atherosclerotic calcifications. Electronically signed by: Ryan Salvage MD 04/12/2024 11:47 AM EST RP Workstation: [id number]    EKG: Personally reviewed by me which shows normal sinus rhythm   Consultant: GI and general surgery  Code Status: Full code  Microbiology none  Antibiotics: None  Family Communication:  Patients' condition and plan of care  including tests being ordered have been discussed with the patient and spouse at bedside who indicate understanding and agree with the plan.   Status is: Observation   Severity of Illness: The appropriate patient status for this patient is OBSERVATION. Observation status is judged to be reasonable and necessary in order to provide the required intensity of service to ensure the patient's safety. The patient's presenting symptoms, physical exam findings, and initial radiographic and laboratory data in the context of their medical condition is felt to place them at decreased risk for further clinical deterioration. Furthermore, it is anticipated that the patient will be medically stable for discharge from the hospital within 2 midnights of admission.   Signed, Vernal Alstrom, MD [facility] 04/12/2024         [1]  Allergies Allergen Reactions   Penicillins Hives   "

## 2024-04-12 NOTE — Consult Note (Signed)
 Reason for Consult: Splenic laceration Referring Physician: POKHREL MD  Nicholas Shields is an 64 y.o. male.  HPI: Patient is a 64 year old male 1 day status post colonoscopy.  He developed pain after procedure and had more pain this morning.  He was sent for a CT scan as an outpatient which showed a splenic hematoma/laceration.  He was told to go to the emergency room.  He feels better now.  I reviewed his CT scan which shows a grade 1 splenic laceration/hematoma.  There is no active extravasation.  He has been eating all day.  His pain is better.  His hemoglobin is 13.  There is no free or air or signs of bowel injury.  Past Medical History:  Diagnosis Date   Hypertension     Past Surgical History:  Procedure Laterality Date   BACK SURGERY      History reviewed. No pertinent family history.  Social History:  reports that he has never smoked. He has never used smokeless tobacco. He reports that he does not drink alcohol and does not use drugs.  Allergies: Allergies[1]  Medications: I have reviewed the patient's current medications.  Results for orders placed or performed during the hospital encounter of 04/12/24 (from the past 48 hours)  Comprehensive metabolic panel     Status: Abnormal   Collection Time: 04/12/24  1:38 PM  Result Value Ref Range   Sodium 142 135 - 145 mmol/L   Potassium 3.8 3.5 - 5.1 mmol/L   Chloride 104 98 - 111 mmol/L   CO2 32 22 - 32 mmol/L   Glucose, Bld 83 70 - 99 mg/dL    Comment: Glucose reference range applies only to samples taken after fasting for at least 8 hours.   BUN 7 (L) 8 - 23 mg/dL   Creatinine, Ser 9.02 0.61 - 1.24 mg/dL   Calcium 9.6 8.9 - 89.6 mg/dL   Total Protein 6.5 6.5 - 8.1 g/dL   Albumin 4.0 3.5 - 5.0 g/dL   AST 28 15 - 41 U/L   ALT 26 0 - 44 U/L   Alkaline Phosphatase 53 38 - 126 U/L   Total Bilirubin 0.6 0.0 - 1.2 mg/dL   GFR, Estimated >39 >39 mL/min    Comment: (NOTE) Calculated using the CKD-EPI Creatinine Equation  (2021)    Anion gap 6 5 - 15    Comment: Performed at [facility], [address] [place], [place] [address]  Lipase, blood     Status: None   Collection Time: 04/12/24  1:38 PM  Result Value Ref Range   Lipase 32 11 - 51 U/L    Comment: Performed at [facility], [address] [place], [place] [address]  CBC with Diff     Status: Abnormal   Collection Time: 04/12/24  1:38 PM  Result Value Ref Range   WBC 8.9 4.0 - 10.5 K/uL   RBC 4.14 (L) 4.22 - 5.81 MIL/uL   Hemoglobin 13.2 13.0 - 17.0 g/dL   HCT 61.6 (L) 60.9 - 47.9 %   MCV 92.5 80.0 - 100.0 fL   MCH 31.9 26.0 - 34.0 pg   MCHC 34.5 30.0 - 36.0 g/dL   RDW 88.3 88.4 - 84.4 %   Platelets 173 150 - 400 K/uL   nRBC 0.0 0.0 - 0.2 %   Neutrophils Relative % 69 %   Neutro Abs 6.2 1.7 - 7.7 K/uL   Lymphocytes Relative 20 %   Lymphs Abs  1.8 0.7 - 4.0 K/uL   Monocytes Relative 9 %   Monocytes Absolute 0.8 0.1 - 1.0 K/uL   Eosinophils Relative 2 %   Eosinophils Absolute 0.2 0.0 - 0.5 K/uL   Basophils Relative 0 %   Basophils Absolute 0.0 0.0 - 0.1 K/uL   Immature Granulocytes 0 %   Abs Immature Granulocytes 0.02 0.00 - 0.07 K/uL    Comment: Performed at [facility], [address] [place], [place] [address]  Troponin T, High Sensitivity     Status: None   Collection Time: 04/12/24  2:31 PM  Result Value Ref Range   Troponin T High Sensitivity 7 0 - 19 ng/L    Comment: (NOTE) Biotin concentrations > 1000 ng/mL falsely decrease TnT results.  Serial cardiac troponin measurements are suggested.  Refer to the Links section for chest pain algorithms and additional  guidance. Performed at [facility], [address], [place], [place] [address]   Urinalysis, Routine w reflex microscopic -Urine, Clean Catch     Status: Abnormal   Collection Time: 04/12/24  4:00 PM  Result Value Ref Range   Color, Urine YELLOW YELLOW   APPearance CLEAR CLEAR    Specific Gravity, Urine >1.046 (H) 1.005 - 1.030   pH 6.0 5.0 - 8.0   Glucose, UA NEGATIVE NEGATIVE mg/dL   Hgb urine dipstick NEGATIVE NEGATIVE   Bilirubin Urine NEGATIVE NEGATIVE   Ketones, ur NEGATIVE NEGATIVE mg/dL   Protein, ur NEGATIVE NEGATIVE mg/dL   Nitrite NEGATIVE NEGATIVE   Leukocytes,Ua NEGATIVE NEGATIVE    Comment: Performed at [facility], [address], [place], [place] [address]  Troponin T, High Sensitivity     Status: None   Collection Time: 04/12/24  4:16 PM  Result Value Ref Range   Troponin T High Sensitivity 7 0 - 19 ng/L    Comment: (NOTE) Biotin concentrations > 1000 ng/mL falsely decrease TnT results.  Serial cardiac troponin measurements are suggested.  Refer to the Links section for chest pain algorithms and additional  guidance. Performed at [facility], [address], [place], [place] [address]     CT ABDOMEN PELVIS W CONTRAST Result Date: 04/12/2024 EXAM: CT ABDOMEN AND PELVIS WITH CONTRAST 04/12/2024 10:36:39 AM TECHNIQUE: CT of the abdomen and pelvis was performed with the administration of 100 cc isovue  300 intravenous contrast. Multiplanar reformatted images are provided for review. Automated exposure control, iterative reconstruction, and/or weight-based adjustment of the mA/kV was utilized to reduce the radiation dose to as low as reasonably achievable. COMPARISON: None available. CLINICAL HISTORY: acute left abdominal pain, assessment for splenic injury. FINDINGS: LOWER CHEST: Atelectasis in the left lower lobe and lingula along the left hemidiaphragm. LIVER: There is a small amount of perihepatic complex fluid favoring a small amount of perihepatic hemoperitoneum, without a well defined focal hepatic laceration. GALLBLADDER AND BILE DUCTS: Gallbladder is unremarkable. No biliary ductal dilatation. SPLEEN: Abnormal high density fluid favoring hematoma along the lateral spleen and tracking along the adjacent  lateral abdominal wall deep to the left 8th and 9th ribs, without a well delineated rib fracture. Appearance favors local hematoma and a component of subcapsular hematoma along the spleen is not excluded. A well defined focal splenic laceration is not identified. The thickness of the lateral fluid is about 2 cm on image 10 series 2. PANCREAS: No acute abnormality. ADRENAL GLANDS: No acute abnormality. KIDNEYS, URETERS AND BLADDER: Mild prostatomegaly. No stones in the kidneys or ureters. No  hydronephrosis. No perinephric or periureteral stranding. Urinary bladder is unremarkable. GI AND BOWEL: Fluid filled descending colon with adjacent small amount of hemoperitoneum while in the left paracolic gutter, appearance is not considered highly specific for bowel injury but the possibility of bowel injury should be considered if the patient's clinical course is unusual or suggestive. Narrowed segments of the transverse colon are probably due to peristaltic activity, but correlation with the patient's colon cancer screening history is recommended. Stomach demonstrates no acute abnormality. There is no bowel obstruction. PERITONEUM AND RETROPERITONEUM: There is about 180 cc of mildly complex fluid in the pelvis compatible with pelvic hemoperitoneum. No source of active bleeding is identified. No free air. VASCULATURE: Abdominal aortic atheromatous vascular calcifications. Aorta is normal in caliber. LYMPH NODES: No lymphadenopathy. REPRODUCTIVE ORGANS: No acute abnormality. BONES AND SOFT TISSUES: No acute osseous abnormality. No focal soft tissue abnormality. IMPRESSION: 1. High density fluid along the lateral spleen and adjacent lateral abdominal wall, favoring blood products likely from otherwise occult splenic injury; a component of subcapsular splenic hematoma is not excluded. 2. Small volume perihepatic hemoperitoneum without a focal hepatic laceration. 3. Pelvic hemoperitoneum, with about 180 cc of complex fluid in  the pelvis . 4. Fluid-filled descending colon with small amount of adjacent hemoperitoneum in the left paracolic gutter. Specific indicators of bowel injury not identified, but consider bowel injury if the clinical course is unusual or suggestive. 5. No source of active bleeding is identified. 6. Mild prostatomegaly. 7. Abdominal aortic atherosclerotic calcifications. Electronically signed by: Ryan Salvage MD 04/12/2024 11:47 AM EST RP Workstation: [id number]    Review of Systems  Gastrointestinal:  Positive for abdominal pain.  All other systems reviewed and are negative.  Blood pressure 123/62, pulse 63, temperature 98.3 F (36.8 C), temperature source Oral, resp. rate 16, height 5' 8 (1.727 m), weight 81.6 kg, SpO2 96%. Physical Exam Cardiovascular:     Rate and Rhythm: Normal rate.  Pulmonary:     Effort: Pulmonary effort is normal.  Abdominal:     General: Abdomen is flat. There is no distension.     Tenderness: There is no abdominal tenderness.  Neurological:     General: No focal deficit present.  Psychiatric:        Mood and Affect: Mood normal.     Assessment/Plan: Grade 1 splenic laceration status post colonoscopy  He is hemodynamically stable  I reviewed his CT scan which shows no evidence extravasation or expanding hematoma.  There is a small hemoperitoneum but this appears stable.  He looks well.  He is okay to eat from a surgery standpoint  Would monitor his hemoglobin overnight  Recommend consulting GI to make them aware of this patient being admitted  No acute surgical need.  If he develops a signs of ongoing bleeding evidenced by drop in hemoglobin, recommend IR consultation for embolization.  Recommend transfusing as needed but I see no evidence that he requires blood at this point in time  Debby DELENA Shipper MD 04/12/2024, 8:52 PM    High complexity     [1]  Allergies Allergen Reactions   Penicillins Hives

## 2024-04-12 NOTE — Hospital Course (Addendum)
 Nicholas Shields

## 2024-04-12 NOTE — ED Notes (Signed)
 First poc with patient, Pt resting in bed with spouse at bedside. PT axox4. GCS 15. C/o left shoulder pain 2/10, states worse with deep inspirations.  Denies n/v/d.  No respiratory distress noted.  VS obtained and updated. Pt and spouse updated on plan of care.  Verbalized understanding.  PT given ice water upon request

## 2024-04-12 NOTE — ED Triage Notes (Signed)
 Patient has difficult colonoscopy done yesterday, still having abdominal discomfort and radiating left shoulder pain, passing gas, got CT abdomen today which showed fluid/blood on spleen, to be admitted for obs. Patient is alert and oriented x 4. Airway patent, respirations even and unlabored. Skin normal, warm and dry.

## 2024-04-12 NOTE — ED Notes (Addendum)
 Pt. Assisted to bathroom for safety. Pt. Gait steady on his feet.

## 2024-04-13 DIAGNOSIS — S3600XD Unspecified injury of spleen, subsequent encounter: Secondary | ICD-10-CM | POA: Diagnosis not present

## 2024-04-13 DIAGNOSIS — I10 Essential (primary) hypertension: Secondary | ICD-10-CM

## 2024-04-13 DIAGNOSIS — K661 Hemoperitoneum: Secondary | ICD-10-CM

## 2024-04-13 LAB — BASIC METABOLIC PANEL WITH GFR
Anion gap: 4 — ABNORMAL LOW (ref 5–15)
BUN: 8 mg/dL (ref 8–23)
CO2: 29 mmol/L (ref 22–32)
Calcium: 8.5 mg/dL — ABNORMAL LOW (ref 8.9–10.3)
Chloride: 107 mmol/L (ref 98–111)
Creatinine, Ser: 0.82 mg/dL (ref 0.61–1.24)
GFR, Estimated: >60 mL/min
Glucose, Bld: 95 mg/dL (ref 70–99)
Potassium: 3.6 mmol/L (ref 3.5–5.1)
Sodium: 140 mmol/L (ref 135–145)

## 2024-04-13 LAB — HEMOGLOBIN AND HEMATOCRIT, BLOOD
HCT: 31.9 % — ABNORMAL LOW (ref 39.0–52.0)
HCT: 32.1 % — ABNORMAL LOW (ref 39.0–52.0)
HCT: 33.5 % — ABNORMAL LOW (ref 39.0–52.0)
Hemoglobin: 10.8 g/dL — ABNORMAL LOW (ref 13.0–17.0)
Hemoglobin: 11 g/dL — ABNORMAL LOW (ref 13.0–17.0)
Hemoglobin: 11.2 g/dL — ABNORMAL LOW (ref 13.0–17.0)

## 2024-04-13 MED ORDER — ACETAMINOPHEN 325 MG PO TABS: 650.0000 mg | ORAL_TABLET | Freq: Four times a day (QID) | ORAL | Status: AC | PRN

## 2024-04-13 MED ORDER — SODIUM CHLORIDE 0.9 % IV SOLN: INTRAVENOUS | Status: DC

## 2024-04-13 MED ORDER — DICYCLOMINE HCL 20 MG PO TABS: 20.0000 mg | ORAL_TABLET | Freq: Two times a day (BID) | ORAL | Status: DC

## 2024-04-13 MED ORDER — POLYETHYLENE GLYCOL 3350 17 G PO PACK: 17.0000 g | PACK | Freq: Every day | ORAL | Status: AC | PRN

## 2024-04-13 MED ORDER — POTASSIUM CHLORIDE CRYS ER 10 MEQ PO TBCR
20.0000 meq | EXTENDED_RELEASE_TABLET | Freq: Once | ORAL | Status: AC
  Administered 2024-04-13: 20 meq via ORAL
  Filled 2024-04-13: qty 2

## 2024-04-13 NOTE — Consult Note (Signed)
 Referring Provider: ED Primary Care Physician:  [name], MD Primary Gastroenterologist:  Dr. Saintclair  Reason for Consultation: Splenic injury  HPI: Nicholas Shields is a 64 y.o. male currently admitted to the hospital with abnormal CT scan concerning for splenic injury.  He underwent colonoscopy with Dr. Saintclair April 11, 2024.  Procedure was difficult.  Subsequently patient developed shoulder pain and abdominal pain.  CT scan was ordered as outpatient which showed finding concerning for subcapsular splenic hematoma as well as small amount of hemoperitoneum.  CT scan also showed fluid-filled descending colon with small amount of additional hemoperitoneum.  Patient was advised to come to hospital for observation.  Patient seen and examined at bedside in the ED.  Mild drop in hemoglobin noted at 10.8 could be dilutional.  His abdominal pain has resolved.  Shoulder pain has resolved.  He has not had bowel movement since his colonoscopy.  Denies any fever or chills.  Past Medical History:  Diagnosis Date   Hypertension     Past Surgical History:  Procedure Laterality Date   BACK SURGERY      Prior to Admission medications  Medication Sig Start Date End Date Taking? Authorizing Provider  aspirin EC 81 MG tablet Take 81 mg by mouth daily. Swallow whole.   Yes [provider]  fluticasone  (FLONASE ) 50 MCG/ACT nasal spray Place 1 spray into both nostrils daily as needed. 05/10/18  Yes [provider]  hydrochlorothiazide (HYDRODIURIL) 25 MG tablet Take 25 mg by mouth every evening. 09/14/18  Yes [provider]  losartan (COZAAR) 100 MG tablet Take 100 mg by mouth every evening. 09/14/18  Yes [provider]  Multiple Vitamin (MULTIVITAMIN WITH MINERALS) TABS tablet Take 1 tablet by mouth daily.   Yes [provider]  rosuvastatin (CRESTOR) 20 MG tablet Take 20 mg by mouth daily. 03/27/24  Yes [provider]  dicyclomine  (BENTYL ) 20 MG  tablet Take 1 tablet (20 mg total) by mouth 2 (two) times daily. 10/23/23   Mannie Pac T, DO  ondansetron  (ZOFRAN  ODT) 8 MG disintegrating tablet Take 1 tablet (8 mg total) by mouth every 8 (eight) hours as needed for nausea or vomiting. Patient not taking: Reported on 04/12/2024 10/28/18   Randol Simmonds, MD    Scheduled Meds:  dicyclomine   20 mg Oral BID   fluticasone   1 spray Each Nare Daily   sodium chloride  flush  3 mL Intravenous Q12H   Continuous Infusions:  sodium chloride      sodium chloride  125 mL/hr at 04/13/24 0800   PRN Meds:.sodium chloride , acetaminophen  **OR** acetaminophen , hydrALAZINE , HYDROcodone -acetaminophen , morphine  injection, ondansetron , polyethylene glycol, sodium chloride  flush  Allergies as of 04/12/2024 - Review Complete 04/12/2024  Allergen Reaction Noted   Penicillins Hives 10/28/2018    History reviewed. No pertinent family history.  Social History   Socioeconomic History   Marital status: Married    Spouse name: Not on file   Number of children: Not on file   Years of education: Not on file   Highest education level: Not on file  Occupational History   Not on file  Tobacco Use   Smoking status: Never   Smokeless tobacco: Never  Vaping Use   Vaping status: Never Used  Substance and Sexual Activity   Alcohol use: Never   Drug use: Never   Sexual activity: Not on file  Other Topics Concern   Not on file  Social History Narrative   Not on file   Social Drivers  of Health   Tobacco Use: Low Risk (04/12/2024)   Patient History    Smoking Tobacco Use: Never    Smokeless Tobacco Use: Never    Passive Exposure: Not on file  Financial Resource Strain: Not on file  Food Insecurity: Not on file  Transportation Needs: Not on file  Physical Activity: Not on file  Stress: Not on file  Social Connections: Not on file  Intimate Partner Violence: Not on file  Depression (EYV7-0): Not on file  Alcohol Screen: Not on file  Housing: Not on file   Utilities: Not on file  Health Literacy: Not on file    Review of Systems: All negative except as stated above in HPI.  Physical Exam: Vital signs: Vitals:   04/13/24 0743 04/13/24 0830  BP:  116/60  Pulse:  74  Resp:  19  Temp: 98.4 F (36.9 C)   SpO2:  95%     General:   Alert,  Well-developed, well-nourished, pleasant and cooperative in NAD Lungs: No visible respiratory distress Heart:  Regular rate and rhythm; no murmurs, clicks, rubs,  or gallops. Abdomen: Soft, nontender, nondistended, bowel sound present, no peritoneal signs,  mood and affect normal Alert and oriented x 3 Rectal:  Deferred  GI:  Lab Results: Recent Labs    04/12/24 1338 04/13/24 0005 04/13/24 0640  WBC 8.9  --   --   HGB 13.2 11.2* 11.0*  HCT 38.3* 33.5* 31.9*  PLT 173  --   --    BMET Recent Labs    04/12/24 1338 04/13/24 0640  NA 142 140  K 3.8 3.6  CL 104 107  CO2 32 29  GLUCOSE 83 95  BUN 7* 8  CREATININE 0.97 0.82  CALCIUM 9.6 8.5*   LFT Recent Labs    04/12/24 1338  PROT 6.5  ALBUMIN 4.0  AST 28  ALT 26  ALKPHOS 53  BILITOT 0.6   PT/INR No results for input(s): LABPROT, INR in the last 72 hours.   Studies/Results: CT ABDOMEN PELVIS W CONTRAST Result Date: 04/12/2024 EXAM: CT ABDOMEN AND PELVIS WITH CONTRAST 04/12/2024 10:36:39 AM TECHNIQUE: CT of the abdomen and pelvis was performed with the administration of 100 cc isovue  300 intravenous contrast. Multiplanar reformatted images are provided for review. Automated exposure control, iterative reconstruction, and/or weight-based adjustment of the mA/kV was utilized to reduce the radiation dose to as low as reasonably achievable. COMPARISON: None available. CLINICAL HISTORY: acute left abdominal pain, assessment for splenic injury. FINDINGS: LOWER CHEST: Atelectasis in the left lower lobe and lingula along the left hemidiaphragm. LIVER: There is a small amount of perihepatic complex fluid favoring a small amount of  perihepatic hemoperitoneum, without a well defined focal hepatic laceration. GALLBLADDER AND BILE DUCTS: Gallbladder is unremarkable. No biliary ductal dilatation. SPLEEN: Abnormal high density fluid favoring hematoma along the lateral spleen and tracking along the adjacent lateral abdominal wall deep to the left 8th and 9th ribs, without a well delineated rib fracture. Appearance favors local hematoma and a component of subcapsular hematoma along the spleen is not excluded. A well defined focal splenic laceration is not identified. The thickness of the lateral fluid is about 2 cm on image 10 series 2. PANCREAS: No acute abnormality. ADRENAL GLANDS: No acute abnormality. KIDNEYS, URETERS AND BLADDER: Mild prostatomegaly. No stones in the kidneys or ureters. No hydronephrosis. No perinephric or periureteral stranding. Urinary bladder is unremarkable. GI AND BOWEL: Fluid filled descending colon with adjacent small amount of hemoperitoneum while in the  left paracolic gutter, appearance is not considered highly specific for bowel injury but the possibility of bowel injury should be considered if the patient's clinical course is unusual or suggestive. Narrowed segments of the transverse colon are probably due to peristaltic activity, but correlation with the patient's colon cancer screening history is recommended. Stomach demonstrates no acute abnormality. There is no bowel obstruction. PERITONEUM AND RETROPERITONEUM: There is about 180 cc of mildly complex fluid in the pelvis compatible with pelvic hemoperitoneum. No source of active bleeding is identified. No free air. VASCULATURE: Abdominal aortic atheromatous vascular calcifications. Aorta is normal in caliber. LYMPH NODES: No lymphadenopathy. REPRODUCTIVE ORGANS: No acute abnormality. BONES AND SOFT TISSUES: No acute osseous abnormality. No focal soft tissue abnormality. IMPRESSION: 1. High density fluid along the lateral spleen and adjacent lateral abdominal wall,  favoring blood products likely from otherwise occult splenic injury; a component of subcapsular splenic hematoma is not excluded. 2. Small volume perihepatic hemoperitoneum without a focal hepatic laceration. 3. Pelvic hemoperitoneum, with about 180 cc of complex fluid in the pelvis . 4. Fluid-filled descending colon with small amount of adjacent hemoperitoneum in the left paracolic gutter. Specific indicators of bowel injury not identified, but consider bowel injury if the clinical course is unusual or suggestive. 5. No source of active bleeding is identified. 6. Mild prostatomegaly. 7. Abdominal aortic atherosclerotic calcifications. Electronically signed by: Ryan Salvage MD 04/12/2024 11:47 AM EST RP Workstation: [id number]    Impression/Plan: -Splenic injury after colonoscopy.  CT scan showed subcapsular splenic hematoma. - Hemoperitoneum.  Small amount, 180 cc complex fluid collection.  Recommendations ------------------------- - Patient is essentially asymptomatic now.  He wants to go home.  We did discuss that he should avoid any strenuous activity, any contact sports or any weight lifting at this time.  Do not lift more than 10 pounds.  Avoid constipation and use MiraLAX  once or twice a day if needed. - Recommend to hold aspirin 81 mg for 1 week.  Avoid NSAIDs for next 2 to 4 weeks - I do not see need for antibiotics at this time. - He would like to go home.  Okay to discharge from GI standpoint.  He is tolerating diet and I recommended him to stay on a soft food for the next 2 days.  He will call our office if he develops any worsening symptoms.  Also discussed with Dr. Saintclair who will arrange for outpatient follow-up.   LOS: 0 days   Layla Lah  MD, FACP 04/13/2024, 8:54 AM  Contact #  [phone]

## 2024-04-13 NOTE — ED Notes (Signed)
 H&H collected.  PT placed on continuous cardiac, pulse ox and bp monitoring per MDO.  Called central monitoring to admit and confirm patient.  Spouse of patient remains at bedside. No respiratory distress noted.

## 2024-04-13 NOTE — ED Notes (Signed)
 Breakfast tray provided to patient.

## 2024-04-13 NOTE — Discharge Summary (Signed)
 Physician Discharge Summary  Nicholas Shields FMW:[id number] DOB: 13-Jun-1960 DOA: 04/12/2024  PCP: [name], MD  Admit date: 04/12/2024 Discharge date: 04/13/2024  Time spent: 60 minutes  Recommendations for Outpatient Follow-up:  Follow-up with [name], MD in 1 week.  On follow-up patient will need a CBC done to follow-up on counts.  Patient will need a basic metabolic profile done to follow-up on electrolytes and renal function.  Blood pressure will need to be reassessed on follow-up. Follow-up with Dr. Saintclair, gastroenterology in 2 weeks.   Discharge Diagnoses:  Principal Problem:   Spleen injury Active Problems:   Hypertension   Hemoperitoneum   Discharge Condition: Stable and improved.  Diet recommendation: Soft diet x 2 days and then advance to regular diet as tolerated  Filed Weights   04/12/24 1934  Weight: 81.6 kg    History of present illness:  HPI per [name] Patient is a 64 years old male with past medical history of hypertension who had undergone colonoscopy yesterday by [name] presented to hospital with abdominal pain radiating to the left shoulder.  Patient has had the procedure yesterday which was difficult and subsequently experienced some pain in the abdomen after the procedure.  This persisted so he called his GI doctor in the morning and a CT scan was ordered as outpatient which showed a high density fluid along the lateral spleen and abdominal wall from possible occult splenic injury with some perihepatic and pelvic Hemi peritoneum.  Patient was then advised to come to the hospital for further observation.  In the ED patient did not complain of significant pain or discomfort but had left shoulder pain worse on deep inspiration around 2/ 10 in intensity.  Denies any nausea vomiting diarrhea but had mild abdominal bloating..  Denies any shortness of breath dyspnea or fever.  Denies any urinary urgency, frequency or dysuria.  Denies any  dizziness, lightheadedness or syncope.   In the ED, vitals were notable for blood pressure of 140/ 81.  No tachycardia.  Patient was afebrile.  Labs were notable for hemoglobin of 13.2 with WBC at 8.9.  Lipase of 32. BMP essentially within normal range.  Urinalysis was negative.  Patient was then considered for observation in the hospital.  Hospital Course:  #1 abdominal pain/left shoulder pain secondary to splenic laceration status post colonoscopy - Patient noted to have presented with abdominal pain radiating to the left shoulder which occurred postprocedure of colonoscopy. - CT scan ordered as outpatient showed high density fluid along the lateral spleen and abdominal wall from possible splenic injury with some perihepatic and pelvic hemoperitoneum. - Patient symptoms improved during the hospitalization - Serial CBCs obtained with hemoglobin stabilizing at 10.8 by day of discharge - GI and general surgery consulted. - It was felt per general surgery that patient's hemoglobin had stabilized, patient did not show any signs of bleeding, and no further surgical intervention needed per general surgery. - Patient was cleared by general surgery for discharge. - Patient also seen in consultation by gastroenterology and as patient remained asymptomatic.  Patient was stable for discharge - GI recommended and discussed with patient to avoid strenuous activity, no contact sports or weightlifting at this time, do not lift more than 10 pounds, avoid constipation and use MiraLAX  once or twice daily as needed. - GI recommended to hold aspirin 81 mg daily x 1 week and to avoid NSAIDs for the next 2 to 4 weeks. - Patient will be discharged home in stable  and improved condition with outpatient follow-up with PCP and GI.  2.  History of hypertension -Patient noted to have been on HCTZ and losartan in the outpatient setting which were held during the hospitalization. - HCTZ will be held on discharge until  follow-up with PCP. - Patient's ARB to be resumed 3 to 4 days postdischarge. - Outpatient follow-up with PCP.  Procedures: None   Consultations: Gastroenterology: [name] 04/13/2024 General Surgery: [name] 04/12/2021  Discharge Exam: Vitals:   04/13/24 1345 04/13/24 1351  BP: 124/79   Pulse: 62   Resp: 17   Temp:  98 F (36.7 C)  SpO2: 97%     General: NAD Cardiovascular: RRR no murmurs rubs or gallops.  No JVD.  No lower extremity edema. Respiratory: Clear to auscultation bilaterally.  No wheezes, no crackles, no rhonchi.  Fair air movement.  Speaking in full sentences.  Discharge Instructions   Discharge Instructions     Diet general   Complete by: As directed    Soft diet x 2 days and then may advance to a regular diet   Discharge instructions   Complete by: As directed    No strenuous activity No contact sports No weight lifting.  No lifting over > 10 pounds.  Avoid constipation and may use MiraLAX  once or twice daily as needed.  Hold aspirin until follow-up with GI. No NSAIDs for the next 4 weeks. Soft diet for the next 2 days and may advance to a regular diet.   Increase activity slowly   Complete by: As directed       Allergies as of 04/13/2024       Reactions   Penicillins Hives   Did it involve swelling of the face/tongue/throat, SOB, or low BP? No Did it involve sudden or severe rash/hives, skin peeling, or any reaction on the inside of your mouth or nose? Yes Did you need to seek medical attention at a hospital or doctor's office? No When did it last happen?   Deacades ago    If all above answers are NO, may proceed with cephalosporin use.          Medication List     PAUSE taking these medications    aspirin EC 81 MG tablet Wait to take this until your doctor or other care provider tells you to start again. Take 81 mg by mouth daily. Swallow whole.   hydrochlorothiazide 25 MG tablet Wait to take this until your doctor or  other care provider tells you to start again. Commonly known as: HYDRODIURIL Take 25 mg by mouth every evening.   losartan 100 MG tablet Wait to take this until: April 16, 2024 Commonly known as: COZAAR Take 100 mg by mouth every evening.       TAKE these medications    acetaminophen  325 MG tablet Commonly known as: TYLENOL  Take 2 tablets (650 mg total) by mouth every 6 (six) hours as needed for mild pain (pain score 1-3) or fever (or Fever >/= 101).   dicyclomine  20 MG tablet Commonly known as: BENTYL  Take 1 tablet (20 mg total) by mouth 2 (two) times daily.   fluticasone  50 MCG/ACT nasal spray Commonly known as: FLONASE  Place 1 spray into both nostrils daily as needed.   multivitamin with minerals Tabs tablet Take 1 tablet by mouth daily.   ondansetron  8 MG disintegrating tablet Commonly known as: Zofran  ODT Take 1 tablet (8 mg total) by mouth every 8 (eight) hours as needed for nausea  or vomiting.   polyethylene glycol 17 g packet Commonly known as: MIRALAX  / GLYCOLAX  Take 17 g by mouth daily as needed for mild constipation.   rosuvastatin 20 MG tablet Commonly known as: CRESTOR Take 20 mg by mouth daily.       Allergies[1]  Follow-up Information     Kinsinger, Herlene Righter, MD Follow up.   Specialty: General Surgery Why: As needed Contact information: [address] [place] [address] [phone]         [name], MD. Schedule an appointment as soon as possible for a visit in 1 week(s).   Specialty: Family Medicine Why: Will need CBC and BMET on follow-up Contact information: [address] [place] [address] [phone]         Saintclair Jasper, MD. Schedule an appointment as soon as possible for a visit in 2 week(s).   Specialty: Gastroenterology Contact information: [address] [place] [address] [phone]                  The results of significant diagnostics from this  hospitalization (including imaging, microbiology, ancillary and laboratory) are listed below for reference.    Significant Diagnostic Studies: CT ABDOMEN PELVIS W CONTRAST Result Date: 04/12/2024 EXAM: CT ABDOMEN AND PELVIS WITH CONTRAST 04/12/2024 10:36:39 AM TECHNIQUE: CT of the abdomen and pelvis was performed with the administration of 100 cc isovue  300 intravenous contrast. Multiplanar reformatted images are provided for review. Automated exposure control, iterative reconstruction, and/or weight-based adjustment of the mA/kV was utilized to reduce the radiation dose to as low as reasonably achievable. COMPARISON: None available. CLINICAL HISTORY: acute left abdominal pain, assessment for splenic injury. FINDINGS: LOWER CHEST: Atelectasis in the left lower lobe and lingula along the left hemidiaphragm. LIVER: There is a small amount of perihepatic complex fluid favoring a small amount of perihepatic hemoperitoneum, without a well defined focal hepatic laceration. GALLBLADDER AND BILE DUCTS: Gallbladder is unremarkable. No biliary ductal dilatation. SPLEEN: Abnormal high density fluid favoring hematoma along the lateral spleen and tracking along the adjacent lateral abdominal wall deep to the left 8th and 9th ribs, without a well delineated rib fracture. Appearance favors local hematoma and a component of subcapsular hematoma along the spleen is not excluded. A well defined focal splenic laceration is not identified. The thickness of the lateral fluid is about 2 cm on image 10 series 2. PANCREAS: No acute abnormality. ADRENAL GLANDS: No acute abnormality. KIDNEYS, URETERS AND BLADDER: Mild prostatomegaly. No stones in the kidneys or ureters. No hydronephrosis. No perinephric or periureteral stranding. Urinary bladder is unremarkable. GI AND BOWEL: Fluid filled descending colon with adjacent small amount of hemoperitoneum while in the left paracolic gutter, appearance is not considered highly specific for  bowel injury but the possibility of bowel injury should be considered if the patient's clinical course is unusual or suggestive. Narrowed segments of the transverse colon are probably due to peristaltic activity, but correlation with the patient's colon cancer screening history is recommended. Stomach demonstrates no acute abnormality. There is no bowel obstruction. PERITONEUM AND RETROPERITONEUM: There is about 180 cc of mildly complex fluid in the pelvis compatible with pelvic hemoperitoneum. No source of active bleeding is identified. No free air. VASCULATURE: Abdominal aortic atheromatous vascular calcifications. Aorta is normal in caliber. LYMPH NODES: No lymphadenopathy. REPRODUCTIVE ORGANS: No acute abnormality. BONES AND SOFT TISSUES: No acute osseous abnormality. No focal soft tissue abnormality. IMPRESSION: 1. High density fluid along the lateral  spleen and adjacent lateral abdominal wall, favoring blood products likely from otherwise occult splenic injury; a component of subcapsular splenic hematoma is not excluded. 2. Small volume perihepatic hemoperitoneum without a focal hepatic laceration. 3. Pelvic hemoperitoneum, with about 180 cc of complex fluid in the pelvis . 4. Fluid-filled descending colon with small amount of adjacent hemoperitoneum in the left paracolic gutter. Specific indicators of bowel injury not identified, but consider bowel injury if the clinical course is unusual or suggestive. 5. No source of active bleeding is identified. 6. Mild prostatomegaly. 7. Abdominal aortic atherosclerotic calcifications. Electronically signed by: Ryan Salvage MD 04/12/2024 11:47 AM EST RP Workstation: [id number]    Microbiology: No results found for this or any previous visit (from the past 240 hours).   Labs: Basic Metabolic Panel: Recent Labs  Lab 04/12/24 1338 04/13/24 0640  NA 142 140  K 3.8 3.6  CL 104 107  CO2 32 29  GLUCOSE 83 95  BUN 7* 8  CREATININE 0.97 0.82  CALCIUM 9.6  8.5*   Liver Function Tests: Recent Labs  Lab 04/12/24 1338  AST 28  ALT 26  ALKPHOS 53  BILITOT 0.6  PROT 6.5  ALBUMIN 4.0   Recent Labs  Lab 04/12/24 1338  LIPASE 32   No results for input(s): AMMONIA in the last 168 hours. CBC: Recent Labs  Lab 04/12/24 1338 04/13/24 0005 04/13/24 0640 04/13/24 1236  WBC 8.9  --   --   --   NEUTROABS 6.2  --   --   --   HGB 13.2 11.2* 11.0* 10.8*  HCT 38.3* 33.5* 31.9* 32.1*  MCV 92.5  --   --   --   PLT 173  --   --   --    Cardiac Enzymes: No results for input(s): CKTOTAL, CKMB, CKMBINDEX, TROPONINI in the last 168 hours. BNP: BNP (last 3 results) No results for input(s): BNP in the last 8760 hours.  ProBNP (last 3 results) No results for input(s): PROBNP in the last 8760 hours.  CBG: No results for input(s): GLUCAP in the last 168 hours.     Signed:  Toribio Hummer MD.  [facility] 04/13/2024, 3:30 PM        [1]  Allergies Allergen Reactions   Penicillins Hives    Did it involve swelling of the face/tongue/throat, SOB, or low BP? No Did it involve sudden or severe rash/hives, skin peeling, or any reaction on the inside of your mouth or nose? Yes Did you need to seek medical attention at a hospital or doctor's office? No When did it last happen?   Deacades ago    If all above answers are NO, may proceed with cephalosporin use.

## 2024-04-13 NOTE — Progress Notes (Signed)
" °   ° °  Chief Complaint/Subjective: Pain better, tolerated diet  Objective: Vital signs in last 24 hours: Temp:  [98.1 F (36.7 C)-98.9 F (37.2 C)] 98.4 F (36.9 C) (01/30 0743) Pulse Rate:  [58-81] 62 (01/30 0700) Resp:  [15-19] 19 (01/30 0700) BP: (94-140)/(44-81) 107/64 (01/30 0700) SpO2:  [94 %-100 %] 95 % (01/30 0700) Weight:  [81.6 kg] 81.6 kg (01/29 1934)   Intake/Output from previous day: 01/29 0701 - 01/30 0700 In: 1000 [IV Piggyback:1000] Out: -   PE: Gen: NAD Resp: nonlabored Card: RRR Abd: soft, nontender  Lab Results:  Recent Labs    04/12/24 1338 04/13/24 0005 04/13/24 0640  WBC 8.9  --   --   HGB 13.2 11.2* 11.0*  HCT 38.3* 33.5* 31.9*  PLT 173  --   --    Recent Labs    04/12/24 1338 04/13/24 0640  NA 142 140  K 3.8 3.6  CL 104 107  CO2 32 29  GLUCOSE 83 95  BUN 7* 8  CREATININE 0.97 0.82  CALCIUM 9.6 8.5*   No results for input(s): LABPROT, INR in the last 72 hours.    Component Value Date/Time   NA 140 04/13/2024 0640   K 3.6 04/13/2024 0640   CL 107 04/13/2024 0640   CO2 29 04/13/2024 0640   GLUCOSE 95 04/13/2024 0640   BUN 8 04/13/2024 0640   CREATININE 0.82 04/13/2024 0640   CALCIUM 8.5 (L) 04/13/2024 0640   PROT 6.5 04/12/2024 1338   ALBUMIN 4.0 04/12/2024 1338   AST 28 04/12/2024 1338   ALT 26 04/12/2024 1338   ALKPHOS 53 04/12/2024 1338   BILITOT 0.6 04/12/2024 1338   GFRNONAA >60 04/13/2024 0640   GFRAA >60 10/28/2018 1115    Assessment/Plan G1 splenic lac after colonoscopy. Hemoglobin 11 today from 11.2 which is stable and shows no signs of bleeding      FEN - reg diet ID - no issues Disposition - should go home today   LOS: 0 days   I reviewed last 24 h vitals and pain scores, last 48 h intake and output, last 24 h labs and trends, and last 24 h imaging results.  This care required straight-forward level of medical decision making.   Herlene Righter [facility] 04/13/2024, 8:14 AM Please see Amion for pager number during day hours 7:00am-4:30pm or 7:00am -11:30am on weekends   "
